# Patient Record
Sex: Male | Born: 1975 | Race: Black or African American | Hispanic: No | Marital: Married | Smoking: Former smoker
Health system: Southern US, Community
[De-identification: ages and names within clinical notes are randomized; demographics above are authoritative.]

## PROBLEM LIST (undated history)

## (undated) DIAGNOSIS — S6291XA Unspecified fracture of right wrist and hand, initial encounter for closed fracture: Secondary | ICD-10-CM

## (undated) DIAGNOSIS — Z973 Presence of spectacles and contact lenses: Secondary | ICD-10-CM

## (undated) DIAGNOSIS — T7840XA Allergy, unspecified, initial encounter: Secondary | ICD-10-CM

## (undated) DIAGNOSIS — L309 Dermatitis, unspecified: Secondary | ICD-10-CM

## (undated) DIAGNOSIS — K279 Peptic ulcer, site unspecified, unspecified as acute or chronic, without hemorrhage or perforation: Secondary | ICD-10-CM

## (undated) HISTORY — DX: Dermatitis, unspecified: L30.9

## (undated) HISTORY — DX: Allergy, unspecified, initial encounter: T78.40XA

## (undated) HISTORY — DX: Unspecified fracture of right wrist and hand, initial encounter for closed fracture: S62.91XA

## (undated) HISTORY — DX: Peptic ulcer, site unspecified, unspecified as acute or chronic, without hemorrhage or perforation: K27.9

## (undated) HISTORY — DX: Presence of spectacles and contact lenses: Z97.3

---

## 1999-04-24 ENCOUNTER — Emergency Department (HOSPITAL_COMMUNITY): Admission: EM | Admit: 1999-04-24 | Discharge: 1999-04-24 | Payer: Self-pay | Admitting: Emergency Medicine

## 2000-09-05 ENCOUNTER — Encounter: Payer: Self-pay | Admitting: Emergency Medicine

## 2000-09-05 ENCOUNTER — Emergency Department (HOSPITAL_COMMUNITY): Admission: EM | Admit: 2000-09-05 | Discharge: 2000-09-06 | Payer: Self-pay | Admitting: Emergency Medicine

## 2001-10-06 ENCOUNTER — Emergency Department (HOSPITAL_COMMUNITY): Admission: EM | Admit: 2001-10-06 | Discharge: 2001-10-06 | Payer: Self-pay | Admitting: Emergency Medicine

## 2001-12-27 ENCOUNTER — Emergency Department (HOSPITAL_COMMUNITY): Admission: EM | Admit: 2001-12-27 | Discharge: 2001-12-27 | Payer: Self-pay | Admitting: Emergency Medicine

## 2003-01-01 ENCOUNTER — Emergency Department (HOSPITAL_COMMUNITY): Admission: EM | Admit: 2003-01-01 | Discharge: 2003-01-01 | Payer: Self-pay | Admitting: Emergency Medicine

## 2004-04-24 ENCOUNTER — Emergency Department (HOSPITAL_COMMUNITY): Admission: EM | Admit: 2004-04-24 | Discharge: 2004-04-24 | Payer: Self-pay | Admitting: Emergency Medicine

## 2004-06-10 ENCOUNTER — Emergency Department (HOSPITAL_COMMUNITY): Admission: EM | Admit: 2004-06-10 | Discharge: 2004-06-10 | Payer: Self-pay | Admitting: Emergency Medicine

## 2004-08-21 ENCOUNTER — Emergency Department (HOSPITAL_COMMUNITY): Admission: EM | Admit: 2004-08-21 | Discharge: 2004-08-21 | Payer: Self-pay | Admitting: Emergency Medicine

## 2005-09-21 ENCOUNTER — Emergency Department (HOSPITAL_COMMUNITY): Admission: EM | Admit: 2005-09-21 | Discharge: 2005-09-21 | Payer: Self-pay | Admitting: Emergency Medicine

## 2006-08-19 ENCOUNTER — Emergency Department (HOSPITAL_COMMUNITY): Admission: EM | Admit: 2006-08-19 | Discharge: 2006-08-19 | Payer: Self-pay | Admitting: *Deleted

## 2010-08-20 HISTORY — PX: BONE CYST EXCISION: SHX376

## 2010-09-20 DIAGNOSIS — K279 Peptic ulcer, site unspecified, unspecified as acute or chronic, without hemorrhage or perforation: Secondary | ICD-10-CM

## 2010-09-20 HISTORY — DX: Peptic ulcer, site unspecified, unspecified as acute or chronic, without hemorrhage or perforation: K27.9

## 2010-10-16 ENCOUNTER — Emergency Department (HOSPITAL_COMMUNITY): Admission: EM | Admit: 2010-10-16 | Discharge: 2010-10-16 | Payer: Self-pay | Admitting: Emergency Medicine

## 2011-02-01 LAB — CBC
HCT: 43.1 % (ref 39.0–52.0)
Hemoglobin: 14.8 g/dL (ref 13.0–17.0)
MCH: 31.9 pg (ref 26.0–34.0)
MCHC: 34.3 g/dL (ref 30.0–36.0)
MCV: 93 fL (ref 78.0–100.0)
Platelets: 203 10*3/uL (ref 150–400)
RBC: 4.63 MIL/uL (ref 4.22–5.81)
RDW: 13.9 % (ref 11.5–15.5)
WBC: 14.4 10*3/uL — ABNORMAL HIGH (ref 4.0–10.5)

## 2011-02-01 LAB — COMPREHENSIVE METABOLIC PANEL
ALT: 31 U/L (ref 0–53)
AST: 25 U/L (ref 0–37)
Albumin: 4 g/dL (ref 3.5–5.2)
Alkaline Phosphatase: 67 U/L (ref 39–117)
BUN: 9 mg/dL (ref 6–23)
CO2: 23 mEq/L (ref 19–32)
Calcium: 9.4 mg/dL (ref 8.4–10.5)
Chloride: 106 mEq/L (ref 96–112)
Creatinine, Ser: 0.96 mg/dL (ref 0.4–1.5)
GFR calc Af Amer: >60 mL/min (ref >60)
GFR calc non Af Amer: >60 mL/min (ref >60)
Glucose, Bld: 105 mg/dL — ABNORMAL HIGH (ref 70–99)
Potassium: 4.2 mEq/L (ref 3.5–5.1)
Sodium: 137 mEq/L (ref 135–145)
Total Bilirubin: 0.4 mg/dL (ref 0.3–1.2)
Total Protein: 7.1 g/dL (ref 6.0–8.3)

## 2011-02-01 LAB — POCT CARDIAC MARKERS
CKMB, poc: <1 ng/mL — ABNORMAL LOW (ref 1.0–8.0)
Troponin i, poc: <0.05 ng/mL (ref 0.00–0.09)

## 2011-02-01 LAB — DIFFERENTIAL
Basophils Absolute: 0 10*3/uL (ref 0.0–0.1)
Basophils Relative: 0 % (ref 0–1)
Eosinophils Absolute: 0.2 10*3/uL (ref 0.0–0.7)
Eosinophils Relative: 1 % (ref 0–5)
Lymphocytes Relative: 19 % (ref 12–46)
Lymphs Abs: 2.7 10*3/uL (ref 0.7–4.0)
Monocytes Absolute: 0.8 10*3/uL (ref 0.1–1.0)
Monocytes Relative: 6 % (ref 3–12)
Neutro Abs: 10.7 10*3/uL — ABNORMAL HIGH (ref 1.7–7.7)
Neutrophils Relative %: 74 % (ref 43–77)

## 2011-02-01 LAB — URINALYSIS, ROUTINE W REFLEX MICROSCOPIC
Bilirubin Urine: NEGATIVE
Glucose, UA: NEGATIVE mg/dL
Hgb urine dipstick: NEGATIVE
Ketones, ur: NEGATIVE mg/dL
Nitrite: NEGATIVE
Protein, ur: NEGATIVE mg/dL
Specific Gravity, Urine: 1.021 (ref 1.005–1.030)
Urobilinogen, UA: 0.2 mg/dL (ref 0.0–1.0)
pH: 5.5 (ref 5.0–8.0)

## 2011-05-03 ENCOUNTER — Ambulatory Visit: Payer: Self-pay | Admitting: Medical

## 2011-05-08 ENCOUNTER — Ambulatory Visit (INDEPENDENT_AMBULATORY_CARE_PROVIDER_SITE_OTHER): Payer: 59 | Admitting: Medical

## 2011-05-08 ENCOUNTER — Encounter: Payer: Self-pay | Admitting: Medical

## 2011-05-08 DIAGNOSIS — Z0000 Encounter for general adult medical examination without abnormal findings: Secondary | ICD-10-CM

## 2011-05-08 DIAGNOSIS — R0600 Dyspnea, unspecified: Secondary | ICD-10-CM

## 2011-05-08 DIAGNOSIS — R350 Frequency of micturition: Secondary | ICD-10-CM

## 2011-05-08 DIAGNOSIS — J45909 Unspecified asthma, uncomplicated: Secondary | ICD-10-CM

## 2011-05-08 DIAGNOSIS — R0609 Other forms of dyspnea: Secondary | ICD-10-CM

## 2011-05-08 LAB — COMPREHENSIVE METABOLIC PANEL
ALT: 24 U/L (ref 0–53)
AST: 20 U/L (ref 0–37)
Albumin: 4.3 g/dL (ref 3.5–5.2)
Calcium: 9.6 mg/dL (ref 8.4–10.5)
Chloride: 106 mEq/L (ref 96–112)
Potassium: 4.4 mEq/L (ref 3.5–5.3)
Total Protein: 6.8 g/dL (ref 6.0–8.3)

## 2011-05-08 LAB — CBC WITH DIFFERENTIAL/PLATELET
Basophils Absolute: 0 10*3/uL (ref 0.0–0.1)
Basophils Relative: 0 % (ref 0–1)
MCHC: 32.1 g/dL (ref 30.0–36.0)
Neutro Abs: 7.8 10*3/uL — ABNORMAL HIGH (ref 1.7–7.7)
Neutrophils Relative %: 66 % (ref 43–77)
RDW: 13.7 % (ref 11.5–15.5)

## 2011-05-08 LAB — POCT URINALYSIS DIPSTICK
Protein, UA: NEGATIVE
Spec Grav, UA: 1.015
Urobilinogen, UA: NEGATIVE
pH, UA: 5

## 2011-05-08 LAB — LIPID PANEL
Cholesterol: 203 mg/dL — ABNORMAL HIGH (ref 0–200)
VLDL: 28 mg/dL (ref 0–40)

## 2011-05-08 LAB — TSH: TSH: 0.645 u[IU]/mL (ref 0.350–4.500)

## 2011-05-08 MED ORDER — ALBUTEROL SULFATE HFA 108 (90 BASE) MCG/ACT IN AERS: 2.0000 | INHALATION_SPRAY | Freq: Four times a day (QID) | RESPIRATORY_TRACT | Status: DC | PRN

## 2011-05-08 NOTE — Patient Instructions (Signed)
Preventative Care for Adults, Male       REGULAR HEALTH EXAMS:  A routine yearly physical is a good way to check in with your primary care provider about your health and preventive screening. It is also an opportunity to share updates about your health and any concerns you have, and receive a thorough all-over exam.   Most health insurance companies pay for at least some preventative services.  Check with your health plan for specific coverages.  WHAT PREVENTATIVE SERVICES DO MEN NEED?  Adult men should have their weight and blood pressure checked regularly.   Men age 35 and older should have their cholesterol levels checked regularly.  Beginning at age 50 and continuing to age 75, men should be screened for colorectal cancer.  Certain people should may need continued testing until age 85.  Other cancer screening may include exams for testicular and prostate cancer.  Updating vaccinations is part of preventative care.  Vaccinations help protect against diseases such as the flu.  Lab tests are generally done as part of preventative care to screen for anemia and blood disorders, to screen for problems with the kidneys and liver, to screen for bladder problems, to check blood sugar, and to check your cholesterol level.  Preventative services generally include counseling about diet, exercise, avoiding tobacco, drugs, excessive alcohol consumption, and sexually transmitted infections.    GENERAL RECOMMENDATIONS FOR GOOD HEALTH:  Healthy diet:  Eat a variety of foods, including fruit, vegetables, animal or vegetable protein, such as meat, fish, chicken, and eggs, or beans, lentils, tofu, and grains, such as rice.  Drink plenty of water daily.  Decrease saturated fat in the diet, avoid lots of red meat, processed foods, sweets, fast foods, and fried foods.  Exercise:  Aerobic exercise helps maintain good heart health. At least 30-40 minutes of moderate-intensity exercise is recommended.  For example, a brisk walk that increases your heart rate and breathing. This should be done on most days of the week.   Find a type of exercise or a variety of exercises that you enjoy so that it becomes a part of your daily life.  Examples are running, walking, swimming, water aerobics, and biking.  For motivation and support, explore group exercise such as aerobic class, spin class, Zumba, Yoga,or  martial arts, etc.    Set exercise goals for yourself, such as a certain weight goal, walk or run in a race such as a 5k walk/run.  Speak to your primary care provider about exercise goals.  Disease prevention:  If you smoke or chew tobacco, find out from your caregiver how to quit. It can literally save your life, no matter how long you have been a tobacco user. If you do not use tobacco, never begin.   Maintain a healthy diet and normal weight. Increased weight leads to problems with blood pressure and diabetes.   The Body Mass Index or BMI is a way of measuring how much of your body is fat. Having a BMI above 27 increases the risk of heart disease, diabetes, hypertension, stroke and other problems related to obesity. Your caregiver can help determine your BMI and based on it develop an exercise and dietary program to help you achieve or maintain this important measurement at a healthful level.  High blood pressure causes heart and blood vessel problems.  Persistent high blood pressure should be treated with medicine if weight loss and exercise do not work.   Fat and cholesterol leaves deposits in your arteries   that can block them. This causes heart disease and vessel disease elsewhere in your body.  If your cholesterol is found to be high, or if you have heart disease or certain other medical conditions, then you may need to have your cholesterol monitored frequently and be treated with medication.   Ask if you should have a stress test if your history suggests this. A stress test is a test done on  a treadmill that looks for heart disease. This test can find disease prior to there being a problem.  Avoid drinking alcohol in excess (more than two drinks per day).  Avoid use of street drugs. Do not share needles with anyone. Ask for professional help if you need assistance or instructions on stopping the use of alcohol, cigarettes, and/or drugs.  Brush your teeth twice a day with fluoride toothpaste, and floss once a day. Good oral hygiene prevents tooth decay and gum disease. The problems can be painful, unattractive, and can cause other health problems. Visit your dentist for a routine oral and dental check up and preventive care every 6-12 months.   Look at your skin regularly.  Use a mirror to look at your back. Notify your caregivers of changes in moles, especially if there are changes in shapes, colors, a size larger than a pencil eraser, an irregular border, or development of new moles.  Safety:  Use seatbelts 100% of the time, whether driving or as a passenger.  Use safety devices such as hearing protection if you work in environments with loud noise or significant background noise.  Use safety glasses when doing any work that could send debris in to the eyes.  Use a helmet if you ride a bike or motorcycle.  Use appropriate safety gear for contact sports.  Talk to your caregiver about gun safety.  Use sunscreen with a SPF (or skin protection factor) of 15 or greater.  Lighter skinned people are at a greater risk of skin cancer. Don't forget to also wear sunglasses in order to protect your eyes from too much damaging sunlight. Damaging sunlight can accelerate cataract formation.   Practice safe sex. Use condoms. Condoms are used for birth control and to help reduce the spread of sexually transmitted infections (or STIs).  Some of the STIs are gonorrhea (the clap), chlamydia, syphilis, trichomonas, herpes, HPV (human papilloma virus) and HIV (human immunodeficiency virus) which causes AIDS.  The herpes, HIV and HPV are viral illnesses that have no cure. These can result in disability, cancer and death.   Keep carbon monoxide and smoke detectors in your home functioning at all times. Change the batteries every 6 months or use a model that plugs into the wall.   Vaccinations:  Stay up to date with your tetanus shots and other required immunizations. You should have a booster for tetanus every 10 years. Be sure to get your flu shot every year, since 5%-20% of the [place] population comes down with the flu. The flu vaccine changes each year, so being vaccinated once is not enough. Get your shot in the fall, before the flu season peaks.   Other vaccines to consider:  Pneumococcal vaccine to protect against certain types of pneumonia.  This is normally recommended for adults age 65 or older.  However, adults younger than 37 years old with certain underlying conditions such as diabetes, heart or lung disease should also receive the vaccine.  Shingles vaccine to protect against Varicella Zoster if you are older than age 60, or younger   than 37 years old with certain underlying illness.  Hepatitis A vaccine to protect against a form of infection of the liver by a virus acquired from food.  Hepatitis B vaccine to protect against a form of infection of the liver by a virus acquired from blood or body fluids, particularly if you work in health care.  If you plan to travel internationally, check with your local health department for specific vaccination recommendations.  Cancer Screening:  Most routine colon cancer screening begins at the age of 50. On a yearly basis, doctors may provide special easy to use take-home tests to check for hidden blood in the stool. Sigmoidoscopy or colonoscopy can detect the earliest forms of colon cancer and is life saving. These tests use a small camera at the end of a tube to directly examine the colon. Speak to your caregiver about this at age 50, when routine  screening begins (and is repeated every 5 years unless early forms of pre-cancerous polyps or small growths are found).   At the age of 50 men usually start screening for prostate cancer every year. Screening may begin at a younger age for those with higher risk. Those at higher risk include African-Americans or having a family history of prostate cancer. There are two types of tests for prostate cancer:   Prostate-specific antigen (PSA) testing. Recent studies raise questions about prostate cancer using PSA and you should discuss this with your caregiver.   Digital rectal exam (in which your doctor's lubricated and gloved finger feels for enlargement of the prostate through the anus).   Screening for testicular cancer.  Do a monthly exam of your testicles. Gently roll each testicle between your thumb and fingers, feeling for any abnormal lumps. The best time to do this is after a hot shower or bath when the tissues are looser. Notify your caregivers of any lumps, tenderness or changes in size or shape immediately.     

## 2011-05-08 NOTE — Progress Notes (Signed)
Subjective:   HPI  Gregory Preston is a 35 y.o. male who presents as a new patient for a complete physical.    His wife encouraged him to get a physical due to some recent concerns.  He recently had an episode 1 wk ago where he felt some trouble breathing.  He had eaten spaghetti that evening, and during the night felt bloated, stomach seemed swollen, and he felt some difficulty with breathing.  He got up for a little while, and then laid   After a while the laid back down, but ended up having to urinate several times that evening.  Since that night has not had any additional similar symptoms.  In general he tries not to eat after 7pm.  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  Had surgical excision and has f/u soon for recheck with surgeon.  He was seen in late 2011 for peptic ulcer after NSAID use.  He had been seen in the ED for chest pain, but diagnosed with peptic ulcer.  No other c/o.  The following portions of the patient's history were reviewed and updated as appropriate: allergies, current medications, past family history, past medical history, past social history, past surgical history and problem list.  Past Medical History  Diagnosis Date  . Asthma   . Allergy   . Eczema   . Wears glasses   . Peptic ulcer disease 11/11    s/p NSAID use, resolved   Past Surgical History  Procedure Date  . Bone cyst excision 10/11    mandible cyst excised    Family History  Problem Relation Age of Onset  . Peripheral vascular disease Father   . Heart disease Maternal Grandfather    No current outpatient prescriptions on file prior to visit.   No Known Allergies   Review of Systems Constitutional: denies fever, chills, sweats, unexpected weight change, anorexia, fatigue Allergy: +occasional congestion, sneezing - works outside in Aeronautical [employer] Dermatology: denies rash ENT: no runny nose, ear pain, sore throat, hoarseness, sinus pain Cardiology: denies chest pain,  palpitations, edema Respiratory: +SOB 1 evening 1 week ago, otherwise, denies cough, shortness of breath, wheezing Gastroenterology: +1 wk ago with distention, mild pain, otherwise denies nausea, vomiting, diarrhea, constipation Hematology: denies bleeding or bruising problems Musculoskeletal: +left knee pain occasionally, otherwise denies myalgias, joint swelling, back pain Ophthalmology: denies vision changes Urology: +1 wk ago with 1 day of urinary frequency, otherwise denies dysuria, difficulty urinating, hematuria, urinary frequency, urgency Neurology: no headache, weakness, tingling, numbness     Objective:   Physical Exam  General appearance: alert, no distress, WD/WN, black male, obese Skin: unremarkable HEENT: normocephalic, sclerae anicteric, PERRLA, EOMi, nares patent, no discharge or erythema, pharynx normal Oral cavity: MMM, no lesions Neck: supple, no lymphadenopathy, no thyromegaly, no masses, no JVD, no bruits Heart: RRR, normal S1, S2, no murmurs Lungs: CTA bilaterally, no wheezes, rhonchi, or rales Abdomen: +bs, soft, non tender, non distended, no masses, no hepatomegaly, no splenomegaly Back: non tender Musculoskeletal: nontender, no swelling, no obvious deformity Extremities: no edema, no cyanosis, no clubbing Pulses: 2+ symmetric, upper and lower extremities, normal cap refill Neurological: alert, oriented x 3, CN2-12 intact, strength normal upper extremities and lower extremities, sensation normal throughout, DTRs 2+ throughout, no cerebellar signs, gait normal GU: nontender, no masses, no hernias, no lesions, normal male external genitalia Psychiatric: normal affect, behavior normal, pleasant    Assessment :    Encounter Diagnoses  Name Primary?  Marland Kitchen  General medical examination Yes  . Urinary frequency   . Dyspnea   . Asthma      Plan:  Advised that his recent symptoms resolved without recurrence, so I suspect he had some abdominal distention, likely due  to diet and increased sodium intake, and the abdominal distention likely led to the dyspnea.  Since it resolved, I don't suspect anything else worrisome.  However, he is obese and smokes.  His father has severe peripheral vascular disease.  Thus, I advised he stop smoking, begin some regular exercise, start eating healthy and try cutting calories down to 2000 daily.  Discussed health lifestyle.  Reviewed his ED notes and EKG from 09/2010 through [facility].  White count was elevate, glucose elevated, questionable fatty liver disease on acute abdominal xray series, and EKG was normal.  Labs today and, we will call with lab results.  Handout on preventive care given today.  Asthma - controlled, refilled inhaler.

## 2011-05-10 ENCOUNTER — Telehealth: Payer: Self-pay | Admitting: *Deleted

## 2011-05-10 NOTE — Telephone Encounter (Addendum)
Message copied by Dorthula Perfect on Wed May 10, 2011  4:53 PM ------      Message from: Jac Canavan      Created: Tue May 09, 2011  8:48 PM       His glucose was a little elevated.  It had also been elevated at the ED visit a few months ago.  Otherwise, his liver, kidney, lytes, cholesterol, thyroid, and urine labs are normal.  He may be at risk for diabetes given ongoing elevated blood glucose.  I want him to work on regular exercise, healthy diet, cut out fast food, large portions, sweets, regular cola/sweet tea, and try and lose weight.  Lets see him back in 54mo on weight, diet, exercise and recheck glucose at that time.  Recheck sooner prn.   Pt notified of lab results.  Agreed to exercise and lose weight.  Scheduled an appointment for 10-09-11 at 9am for a 6 month f/u on weight, diet and exercise and recheck glucose.  CM, LPN

## 2011-10-09 ENCOUNTER — Encounter: Payer: Self-pay | Admitting: Medical

## 2011-10-09 ENCOUNTER — Ambulatory Visit (INDEPENDENT_AMBULATORY_CARE_PROVIDER_SITE_OTHER): Payer: 59 | Admitting: Medical

## 2011-10-09 VITALS — BP 122/80 | HR 68 | Temp 98.4°F | Resp 18 | Ht 72.0 in | Wt 351.0 lb

## 2011-10-09 DIAGNOSIS — R7301 Impaired fasting glucose: Secondary | ICD-10-CM | POA: Insufficient documentation

## 2011-10-09 DIAGNOSIS — E669 Obesity, unspecified: Secondary | ICD-10-CM

## 2011-10-09 LAB — POCT GLYCOSYLATED HEMOGLOBIN (HGB A1C): Hemoglobin A1C: 5.4

## 2011-10-09 NOTE — Progress Notes (Signed)
Subjective:   HPI  Gregory Preston is a 35 y.o. male who presents for 6 mo f/u.   I saw him for physical back in June, and at that time his glucose was 105 and he was advised to make exercise and diet changes to lose weight.  Since then he has cut out regular soda and fried foods, but only eats 1 meal daily, big meal at dinner.  He owns 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 No recent asthma problems, no other problems. No other aggravating or relieving factors.    No other c/o.  The following portions of the patient's history were reviewed and updated as appropriate: allergies, current medications, past family history, past medical history, past social history, past surgical history and problem list.  Past Medical History  Diagnosis Date  . Asthma   . Allergy   . Eczema   . Wears glasses   . Peptic ulcer disease 11/11    s/p NSAID use, resolved   Review of Systems Constitutional: -fever, -chills, -sweats, -unexpected -weight change,-fatigue ENT: -runny nose, -ear pain, -sore throat Cardiology:  -chest pain, -palpitations, -edema Respiratory: -cough, -shortness of breath, -wheezing Gastroenterology: -abdominal pain, -nausea, -vomiting, -diarrhea, -constipation Hematology: -bleeding or bruising problems Musculoskeletal: -arthralgias, -myalgias, -joint swelling, -back pain Ophthalmology: -vision changes Urology: -dysuria, -difficulty urinating, -hematuria, -urinary frequency, -urgency Neurology: -headache, -weakness, -tingling, -numbness   Objective:   Physical Exam  Filed Vitals:   10/09/11 0914  BP: 122/80  Pulse: 68  Temp: 98.4 F (36.9 C)  Resp: 18    General appearance: alert, no distress, WD/WN, obese male Neck: supple, no lymphadenopathy, no thyromegaly, no masses Heart: RRR, normal S1, S2, no murmurs Lungs: CTA bilaterally, no wheezes, rhonchi, or rales Abdomen: +bs, soft, non tender, non distended, no masses,  no hepatomegaly, no splenomegaly Pulses: 2+ symmetric, upper and lower extremities, normal cap refill   Assessment and Plan :    Encounter Diagnoses  Name Primary?  . Obesity Yes  . Impaired fasting blood sugar    HgbA1C 5.4% today. Referral to dietician.  Advised 30+ minutes of exercise daily getting the heart rate up.  Advised he make significant additional diet changes, eat 3 meals daily, but healthy choices.   Follow-up 38mo.

## 2011-11-07 ENCOUNTER — Telehealth: Payer: Self-pay | Admitting: Internal Medicine

## 2011-11-07 ENCOUNTER — Other Ambulatory Visit: Payer: Self-pay | Admitting: Medical

## 2011-11-07 ENCOUNTER — Telehealth: Payer: Self-pay | Admitting: Medical

## 2011-11-07 MED ORDER — ALBUTEROL SULFATE HFA 108 (90 BASE) MCG/ACT IN AERS: 2.0000 | INHALATION_SPRAY | Freq: Four times a day (QID) | RESPIRATORY_TRACT | Status: DC | PRN

## 2011-11-07 NOTE — Telephone Encounter (Signed)
Refill sent but needs appt as his asthma is apparently not controlled

## 2011-11-07 NOTE — Telephone Encounter (Signed)
shane did refill meds but pt was informed that he needs appointment that his asthma is not controlled if he has gone thru that many refills.

## 2011-11-22 ENCOUNTER — Encounter: Payer: Self-pay | Admitting: Medical

## 2011-11-22 ENCOUNTER — Ambulatory Visit (INDEPENDENT_AMBULATORY_CARE_PROVIDER_SITE_OTHER): Payer: 59 | Admitting: Medical

## 2011-11-22 DIAGNOSIS — J45909 Unspecified asthma, uncomplicated: Secondary | ICD-10-CM | POA: Insufficient documentation

## 2011-11-22 MED ORDER — BECLOMETHASONE DIPROPIONATE 80 MCG/ACT IN AERS: 1.0000 | INHALATION_SPRAY | RESPIRATORY_TRACT | Status: DC | PRN

## 2011-11-22 MED ORDER — ALBUTEROL SULFATE HFA 108 (90 BASE) MCG/ACT IN AERS: 2.0000 | INHALATION_SPRAY | Freq: Four times a day (QID) | RESPIRATORY_TRACT | Status: DC | PRN

## 2011-11-22 NOTE — Progress Notes (Signed)
Subjective:   HPI  Gregory Preston is a 36 y.o. male who presents  with recheck on asthma.  He has been using his inhaler several days per week, has frequent wheezing and tightness in his chest. He was diagnosed with asthma as a child. He has never been on a controller medication. 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  He notes that he normally gets flareups with the season change with cold weather as well as spring and fall with temperature changes and pollen changes.  Otherwise has been in his usual state of health.   No other aggravating or relieving factors.    No other c/o.  The following portions of the patient's history were reviewed and updated as appropriate: allergies, current medications, past family history, past medical history, past social history, past surgical history and problem list.  No Known Allergies  No current outpatient prescriptions on file prior to visit.    Past Medical History  Diagnosis Date  . Asthma   . Allergy   . Eczema   . Wears glasses   . Peptic ulcer disease 11/11    s/p NSAID use, resolved    Past Surgical History  Procedure Date  . Bone cyst excision 10/11    mandible cyst excised    Family History  Problem Relation Age of Onset  . Peripheral vascular disease Father   . Heart disease Father     pacemaker  . Heart disease Maternal Grandfather   . Hypertension Neg Hx   . Hyperlipidemia Neg Hx   . Stroke Neg Hx   . Diabetes Neg Hx     History   Social History  . Marital Status: Married    Spouse Name: N/A    Number of Children: N/A  . Years of Education: N/A   Occupational History  . Not on file.   Social History Main Topics  . Smoking status: Former Smoker -- 0.2 packs/day for 10 years    Types: Cigars  . Smokeless tobacco: Never Used  . Alcohol Use: 1.0 oz/week    2 drink(s) per week  . Drug Use: No  . Sexually Active: Not on file     married;  does landscaping; exercise  through work, otherwise no   Other Topics Concern  . Not on file   Social History Narrative  . No narrative on file    Review of Systems Constitutional: -fever, -chills, -sweats, -unexpected -weight change,-fatigue ENT: -runny nose, -ear pain, -sore throat Cardiology:  -chest pain, -palpitations, -edema Respiratory: -cough, +shortness of breath, +wheezing Gastroenterology: -abdominal pain, -nausea, -vomiting, -diarrhea, -constipation    Objective:   Physical Exam  Filed Vitals:   11/22/11 0813  BP: 110/70  Pulse: 88  Temp: 97.9 F (36.6 C)  Resp: 18    General appearence: alert, no distress, WD/WN, obese HEENT: normocephalic, sclerae anicteric, TMs pearly, nares patent, no discharge or erythema, pharynx normal Oral cavity: MMM, no lesions Neck: supple, no lymphadenopathy, no thyromegaly, no masses Heart: RRR, normal S1, S2, no murmurs Lungs: CTA bilaterally, no wheezes, rhonchi, or rales   Assessment and Plan :    Encounter Diagnosis  Name Primary?  Marland Kitchen Asthma Yes   Discussed triggers of asthma, discussed that currently he falls into the severe category of asthma.   We will set him up for pulmonary function studies.  He will begin Qvar once a day, and we'll increase to twice a day in week if symptoms are  not improving.  He'll begin Zyrtec 10 mg over-the-counter nighttime.  He will continue use his albuterol for acute flareups.  Discussed possible triggers of his asthma, discussed different types of medication including controller versus rescue medication.   Call report in 3-4 weeks on symptoms and frequency of rescue inhaler usage.  We will call once we receive pulmonary function study results.

## 2011-11-22 NOTE — Patient Instructions (Addendum)
Your current asthma symptoms are not controlled.  Begin Qvar 80 mcg, 1 inhalation in the morning. Always rinse mouth out with water after each use.   This medication is to help CONTROL your asthma symptoms so you aren't wheezy or as tight.  If you are still having to use your albuterol more than twice weekly, the use the Qvar both morning and night.   Please call me in 3-4 weeks, and give me an update on your symptoms.  You will still use your albuterol inhaler for RESCUE/EMERGENCY use when wheezy or short of breath.   Asthma Prevention Cigarette smoke, house dust, molds, pollens, animal dander, certain insects, exercise, and even cold air are all triggers that can cause an asthma attack. Often, no specific triggers are identified.  Take the following measures around your house to reduce attacks:  Avoid cigarette and other smoke. No smoking should be allowed in a home where someone with asthma lives. If smoking is allowed indoors, it should be done in a room with a closed door, and a window should be opened to clear the air. If possible, do not use a wood-burning stove, kerosene heater, or fireplace. Minimize exposure to all sources of smoke, including incense, candles, fires, and fireworks.   Decrease pollen exposure. Keep your windows shut and use central air during the pollen allergy season. Stay indoors with windows closed from late morning to afternoon, if you can. Avoid mowing the lawn if you have grass pollen allergy. Change your clothes and shower after being outside during this time of year.   Remove molds from bathrooms and wet areas. Do this by cleaning the floors with a fungicide or diluted bleach. Avoid using humidifiers, vaporizers, or swamp coolers. These can spread molds through the air. Fix leaky faucets, pipes, or other sources of water that have mold around them.   Decrease house dust exposure. Do this by using bare floors, vacuuming frequently, and changing furnace and air cooler  filters frequently. Avoid using feather, wool, or foam bedding. Use polyester pillows and plastic covers over your mattress. Wash bedding weekly in hot water (hotter than 130 F).   Try to get someone else to vacuum for you once or twice a week, if you can. Stay out of rooms while they are being vacuumed and for a short while afterward. If you vacuum, use a dust mask (from a hardware store), a double-layered or microfilter vacuum cleaner bag, or a vacuum cleaner with a HEPA filter.   Avoid perfumes, talcum powder, hair spray, paints and other strong odors and fumes.   Keep warm-blooded pets (cats, dogs, rodents, birds) outside the home if they are triggers for asthma. If you can't keep the pet outdoors, keep the pet out of your bedroom and other sleeping areas at all times, and keep the door closed. Remove carpets and furniture covered with cloth from your home. If that is not possible, keep the pet away from fabric-covered furniture and carpets.   Eliminate cockroaches. Keep food and garbage in closed containers. Never leave food out. Use poison baits, traps, powders, gels, or paste (for example, boric acid). If a spray is used to kill cockroaches, stay out of the room until the odor goes away.   Decrease indoor humidity to less than 60%. Use an indoor air cleaning device.   Avoid sulfites in foods and beverages. Do not drink beer or wine or eat dried fruit, processed potatoes, or shrimp if they cause asthma symptoms.   Avoid cold  air. Cover your nose and mouth with a scarf on cold or windy days.   Avoid aspirin. This is the most common drug causing serious asthma attacks.   If exercise triggers your asthma, ask your caregiver how you should prepare before exercising. (For example, ask if you could use your inhaler 10 minutes before exercising.)   Avoid close contact with people who have a cold or the flu since your asthma symptoms may get worse if you catch the infection from them. Wash your  hands thoroughly after touching items that may have been handled by others with a respiratory infection.   Get a flu shot every year to protect against the flu virus, which often makes asthma worse for days to weeks. Also get a pneumonia shot once every five to 10 years.  Call your caregiver if you want further information about measures you can take to help prevent asthma attacks. Document Released: 11/06/2005 Document Revised: 07/19/2011 Document Reviewed: 09/14/2009 [facility] Patient Information 2012 [place], [place].

## 2011-11-23 ENCOUNTER — Encounter (HOSPITAL_COMMUNITY): Payer: 59

## 2011-12-01 NOTE — Telephone Encounter (Signed)
done

## 2011-12-11 ENCOUNTER — Telehealth: Payer: Self-pay | Admitting: Internal Medicine

## 2011-12-12 ENCOUNTER — Other Ambulatory Visit: Payer: Self-pay | Admitting: Medical

## 2011-12-12 MED ORDER — ALBUTEROL SULFATE HFA 108 (90 BASE) MCG/ACT IN AERS: 2.0000 | INHALATION_SPRAY | Freq: Four times a day (QID) | RESPIRATORY_TRACT | Status: DC | PRN

## 2011-12-12 NOTE — Telephone Encounter (Signed)
rx sent

## 2012-01-19 DIAGNOSIS — S6291XA Unspecified fracture of right wrist and hand, initial encounter for closed fracture: Secondary | ICD-10-CM

## 2012-01-19 HISTORY — DX: Unspecified fracture of right wrist and hand, initial encounter for closed fracture: S62.91XA

## 2012-01-29 ENCOUNTER — Emergency Department (HOSPITAL_COMMUNITY): Payer: 59

## 2012-01-29 ENCOUNTER — Emergency Department (HOSPITAL_COMMUNITY)
Admission: EM | Admit: 2012-01-29 | Discharge: 2012-01-29 | Disposition: A | Discharge status: Home / Self Care | Payer: 59 | Attending: Emergency Medicine | Admitting: Emergency Medicine

## 2012-01-29 ENCOUNTER — Encounter (HOSPITAL_COMMUNITY): Payer: Self-pay | Admitting: *Deleted

## 2012-01-29 DIAGNOSIS — W2209XA Striking against other stationary object, initial encounter: Secondary | ICD-10-CM | POA: Insufficient documentation

## 2012-01-29 DIAGNOSIS — M79609 Pain in unspecified limb: Secondary | ICD-10-CM | POA: Insufficient documentation

## 2012-01-29 DIAGNOSIS — S6290XA Unspecified fracture of unspecified wrist and hand, initial encounter for closed fracture: Secondary | ICD-10-CM | POA: Insufficient documentation

## 2012-01-29 MED ORDER — OXYCODONE-ACETAMINOPHEN 5-325 MG PO TABS: 1.0000 | ORAL_TABLET | ORAL | Status: AC | PRN

## 2012-01-29 NOTE — ED Notes (Signed)
Pt reports punched wall yesterday and now has pain/swelling to right hand.

## 2012-01-29 NOTE — ED Provider Notes (Signed)
History     CSN: [id number]  Arrival date & time 01/29/12  1004   First MD Initiated Contact with Patient 01/29/12 1034      Chief Complaint  Patient presents with  . Hand Injury    Right Hand    (Consider location/radiation/quality/duration/timing/severity/associated sxs/prior treatment) Patient is a 36 y.o. male presenting with hand injury. The history is provided by the patient.  Hand Injury  The incident occurred yesterday. The injury mechanism was a direct blow. The pain is present in the right hand. The quality of the pain is described as throbbing and sharp. The pain is moderate. The pain has been constant since the incident. Pertinent negatives include no fever. Associated symptoms comments: He hit a wall with fist last night and has persistent pain over 3rd, and 4th MCP joints. .    Past Medical History  Diagnosis Date  . Asthma   . Allergy   . Eczema   . Wears glasses   . Peptic ulcer disease 11/11    s/p NSAID use, resolved    Past Surgical History  Procedure Date  . Bone cyst excision 10/11    mandible cyst excised    Family History  Problem Relation Age of Onset  . Peripheral vascular disease Father   . Heart disease Father     pacemaker  . Heart disease Maternal Grandfather   . Hypertension Neg Hx   . Hyperlipidemia Neg Hx   . Stroke Neg Hx   . Diabetes Neg Hx     History  Substance Use Topics  . Smoking status: Former Smoker -- 0.2 packs/day for 10 years    Types: Cigars  . Smokeless tobacco: Never Used  . Alcohol Use: 1.0 oz/week    2 drink(s) per week      Review of Systems  Constitutional: Negative for fever and chills.  HENT: Negative.   Respiratory: Negative.   Cardiovascular: Negative.   Gastrointestinal: Negative.   Musculoskeletal: Negative.        See HPI  Skin: Negative.   Neurological: Negative.     Allergies  Review of patient's allergies indicates no known allergies.  Home Medications   Current Outpatient Rx    Name Route Sig Dispense Refill  . ALBUTEROL SULFATE HFA 108 (90 BASE) MCG/ACT IN AERS Inhalation Inhale 2 puffs into the lungs every 6 (six) hours as needed. Wheezing    . BECLOMETHASONE DIPROPIONATE 80 MCG/ACT IN AERS Inhalation Inhale 1 puff into the lungs as needed. 1 Inhaler 5    BP 143/75  Pulse 86  Temp(Src) 99.1 F (37.3 C) (Oral)  Resp 16  Ht 5\' 10"  (1.778 m)  Wt 320 lb (145.151 kg)  BMI 45.92 kg/m2  SpO2 96%  Physical Exam  Constitutional: He is oriented to person, place, and time. He appears well-developed and well-nourished.  Neck: Normal range of motion.  Pulmonary/Chest: Effort normal.  Musculoskeletal: Normal range of motion.       Left hand moderately swollen dorsally with nonsuturable, deep abrasion overlying 3rd MCP joint. Ecchymosis to palmar central hand without swelling. Distal neurovascular exam intact to all digits.  Neurological: He is alert and oriented to person, place, and time.  Skin: Skin is warm and dry.  Psychiatric: He has a normal mood and affect.    ED Course  Procedures (including critical care time)  Labs Reviewed - No data to display No results found.   No diagnosis found.    MDM  Patient denies contact with  another person, specifically, fist-to-mouth injury causing hand abrasion.  D/w Dr. Mina Marble via his nurse. Ok to splint and follow up in office tomorrow.  Rodena Medin, PA-C 01/29/12 1142

## 2012-01-29 NOTE — Discharge Instructions (Signed)
Cast or Splint Care Casts and splints support injured limbs and keep bones from moving while they heal.  HOME CARE  Keep the cast or splint uncovered during the drying period.   A plaster cast can take 24 to 48 hours to dry.   A fiberglass cast will dry in less than 1 hour.   Do not rest the cast on anything harder than a pillow for 24 hours.   Do not put weight on your injured limb. Do not put pressure on the cast. Wait for your doctor's approval.   Keep the cast or splint dry.   Cover the cast or splint with a plastic bag during baths or wet weather.   If you have a cast over your chest and belly (trunk), take sponge baths until the cast is taken off.   Keep your cast or splint clean. Wash a dirty cast with a damp cloth.   Do not put any objects under your cast or splint. Do not scratch the skin under the cast with an object.   Do not take out the padding from inside your cast.   Exercise your joints near the cast as told by your doctor.   Raise (elevate) your injured limb on 1 or 2 pillows for the first 1 to 3 days.  GET HELP RIGHT AWAY IF:  Your cast or splint cracks.   Your cast or splint is too tight or too loose.   You itch badly under the cast.   Your cast gets wet or has a soft spot.   You have a bad smell coming from the cast.   You get an object stuck under the cast.   Your skin around the cast becomes red or raw.   You have new or more pain after the cast is put on.   You have fluid leaking through the cast.   You cannot move your fingers or toes.   Your fingers or toes turn colors or are cool, painful, or puffy (swollen).   You have tingling or lose feeling (numbness) around the injured area.   You have pain or pressure under the cast.   You have trouble breathing or have shortness of breath.   You have chest pain.  MAKE SURE YOU:  Understand these instructions.   Will watch your condition.   Will get help right away if you are not doing  well or get worse.  Document Released: 03/08/2011 Document Revised: 10/26/2011 Document Reviewed: 03/08/2011 [facility] Patient Information 2012 [place], [place].  Hand Fracture, Metacarpals Fractures of metacarpals are breaks in the bones of the hand. They extend from the knuckles to the wrist. These bones can undergo many types of fractures. There are different ways of treating these fractures, all of which may be correct. TREATMENT  Hand fractures can be treated with:   Non-reduction - The fracture is casted without changing the positions of the fracture (bone pieces) involved. This fracture is usually left in a cast for 4 to 6 weeks or as your caregiver thinks necessary.   Closed reduction - The bones are moved back into position without surgery and then casted.   ORIF (open reduction and internal fixation) - The fracture site is opened and the bone pieces are fixed into place with some type of hardware, such as screws, etc. They are then casted.  Your caregiver will discuss the type of fracture you have and the treatment that should be best for that problem. If surgery is chosen,  let your caregivers know about the following.  LET YOUR CAREGIVERS KNOW ABOUT:  Allergies.   Medications you are taking, including herbs, eye drops, over the counter medications, and creams.   Use of steroids (by mouth or creams).   Previous problems with anesthetics or novocaine.   Possibility of pregnancy.   History of blood clots (thrombophlebitis).   History of bleeding or blood problems.   Previous surgeries.   Other health problems.  AFTER THE PROCEDURE After surgery, you will be taken to the recovery area where a nurse will watch and check your progress. Once you are awake, stable, and taking fluids well, barring other problems, you'll be allowed to go home. Once home, an ice pack applied to your operative site may help with pain and keep the swelling down. HOME CARE INSTRUCTIONS   Follow your  caregiver's instructions as to activities, exercises, physical therapy, and driving a car.   Daily exercise is helpful for keeping range of motion and strength. Exercise as instructed.   To lessen swelling, keep the injured hand elevated above the level of your heart as much as possible.   Apply ice to the injury for 15 to 20 minutes each hour while awake for the first 2 days. Put the ice in a plastic bag and place a thin towel between the bag of ice and your cast.   Move the fingers of your casted hand several times a day.   If a plaster or fiberglass cast was applied:   Do not try to scratch the skin under the cast using a sharp or pointed object.   Check the skin around the cast every day. You may put lotion on red or sore areas.   Keep your cast dry. Your cast can be protected during bathing with a plastic bag. Do not put your cast into the water.   If a plaster splint was applied:   Wear your splint for as long as directed by your caregiver or until seen again.   Do not get your splint wet. Protect it during bathing with a plastic bag.   You may loosen the elastic bandage around the splint if your fingers start to get numb, tingle, get cold or turn blue.   Do not put pressure on your cast or splint; this may cause it to break. Especially, do not lean plaster casts on hard surfaces for 24 hours after application.   Take medications as directed by your caregiver.   Only take over-the-counter or prescription medicines for pain, discomfort, or fever as directed by your caregiver.   Follow-up as provided by your caregiver. This is very important in order to avoid permanent injury or disability and chronic pain.  SEEK MEDICAL CARE IF:   Increased bleeding (more than a small spot) from beneath your cast or splint if there is beneath the cast as with an open reduction.   Redness, swelling, or increasing pain in the wound or from beneath your cast or splint.   Pus coming from wound  or from beneath your cast or splint.   An unexplained oral temperature above 102 F (38.9 C) develops, or as your caregiver suggests.   A foul smell coming from the wound or dressing or from beneath your cast or splint.   You have a problem moving any of your fingers.  SEEK IMMEDIATE MEDICAL CARE IF:   You develop a rash   You have difficulty breathing   You have any allergy problems  If  you do not have a window in your cast for observing the wound, a discharge or minor bleeding may show up as a stain on the outside of your cast. Report these findings to your caregiver. MAKE SURE YOU:   Understand these instructions.   Will watch your condition.   Will get help right away if you are not doing well or get worse.  Document Released: 11/06/2005 Document Revised: 10/26/2011 Document Reviewed: 06/25/2008 [facility] Patient Information 2012 [place], [place].  Cryotherapy Cryotherapy means treatment with cold. Ice or gel packs can be used to reduce both pain and swelling. Ice is the most helpful within the first 24 to 48 hours after an injury or flareup from overusing a muscle or joint. Sprains, strains, spasms, burning pain, shooting pain, and aches can all be eased with ice. Ice can also be used when recovering from surgery. Ice is effective, has very few side effects, and is safe for most people to use. PRECAUTIONS  Ice is not a safe treatment option for people with:  Raynaud's phenomenon. This is a condition affecting small blood vessels in the extremities. Exposure to cold may cause your problems to return.   Cold hypersensitivity. There are many forms of cold hypersensitivity, including:   Cold urticaria. Red, itchy hives appear on the skin when the tissues begin to warm after being iced.   Cold erythema. This is a red, itchy rash caused by exposure to cold.   Cold hemoglobinuria. Red blood cells break down when the tissues begin to warm after being iced. The hemoglobin that carry  oxygen are passed into the urine because they cannot combine with blood proteins fast enough.   Numbness or altered sensitivity in the area being iced.  If you have any of the following conditions, do not use ice until you have discussed cryotherapy with your caregiver:  Heart conditions, such as arrhythmia, angina, or chronic heart disease.   High blood pressure.   Healing wounds or open skin in the area being iced.   Current infections.   Rheumatoid arthritis.   Poor circulation.   Diabetes.  Ice slows the blood flow in the region it is applied. This is beneficial when trying to stop inflamed tissues from spreading irritating chemicals to surrounding tissues. However, if you expose your skin to cold temperatures for too long or without the proper protection, you can damage your skin or nerves. Watch for signs of skin damage due to cold. HOME CARE INSTRUCTIONS Follow these tips to use ice and cold packs safely.  Place a dry or damp towel between the ice and skin. A damp towel will cool the skin more quickly, so you may need to shorten the time that the ice is used.   For a more rapid response, add gentle compression to the ice.   Ice for no more than 10 to 20 minutes at a time. The bonier the area you are icing, the less time it will take to get the benefits of ice.   Check your skin after 5 minutes to make sure there are no signs of a poor response to cold or skin damage.   Rest 20 minutes or more in between uses.   Once your skin is numb, you can end your treatment. You can test numbness by very lightly touching your skin. The touch should be so light that you do not see the skin dimple from the pressure of your fingertip. When using ice, most people will feel these normal sensations  in this order: cold, burning, aching, and numbness.   Do not use ice on someone who cannot communicate their responses to pain, such as small children or people with dementia.  HOW TO MAKE AN ICE  PACK Ice packs are the most common way to use ice therapy. Other methods include ice massage, ice baths, and cryo-sprays. Muscle creams that cause a cold, tingly feeling do not offer the same benefits that ice offers and should not be used as a substitute unless recommended by your caregiver. To make an ice pack, do one of the following:  Place crushed ice or a bag of frozen vegetables in a sealable plastic bag. Squeeze out the excess air. Place this bag inside another plastic bag. Slide the bag into a pillowcase or place a damp towel between your skin and the bag.   Mix 3 parts water with 1 part rubbing alcohol. Freeze the mixture in a sealable plastic bag. When you remove the mixture from the freezer, it will be slushy. Squeeze out the excess air. Place this bag inside another plastic bag. Slide the bag into a pillowcase or place a damp towel between your skin and the bag.  SEEK MEDICAL CARE IF:  You develop white spots on your skin. This may give the skin a blotchy (mottled) appearance.   Your skin turns blue or pale.   Your skin becomes waxy or hard.   Your swelling gets worse.  MAKE SURE YOU:   Understand these instructions.   Will watch your condition.   Will get help right away if you are not doing well or get worse.  Document Released: 07/03/2011 Document Revised: 10/26/2011 Document Reviewed: 07/03/2011 [facility] Patient Information 2012 [place], [place].

## 2012-02-15 NOTE — ED Provider Notes (Signed)
Evaluation and management procedures were performed by the PA/NP under my supervision/collaboration.   Jock Mahon D Yiselle Babich, MD 02/15/12 1013 

## 2012-04-08 ENCOUNTER — Ambulatory Visit: Payer: 59 | Admitting: Medical

## 2012-07-13 ENCOUNTER — Emergency Department (HOSPITAL_COMMUNITY)
Admission: EM | Admit: 2012-07-13 | Discharge: 2012-07-14 | Disposition: A | Discharge status: Home / Self Care | Payer: 59 | Attending: Emergency Medicine | Admitting: Emergency Medicine

## 2012-07-13 ENCOUNTER — Encounter (HOSPITAL_COMMUNITY): Payer: Self-pay | Admitting: Emergency Medicine

## 2012-07-13 DIAGNOSIS — J45909 Unspecified asthma, uncomplicated: Secondary | ICD-10-CM | POA: Insufficient documentation

## 2012-07-13 DIAGNOSIS — S39012A Strain of muscle, fascia and tendon of lower back, initial encounter: Secondary | ICD-10-CM

## 2012-07-13 DIAGNOSIS — S335XXA Sprain of ligaments of lumbar spine, initial encounter: Secondary | ICD-10-CM | POA: Insufficient documentation

## 2012-07-13 DIAGNOSIS — X500XXA Overexertion from strenuous movement or load, initial encounter: Secondary | ICD-10-CM | POA: Insufficient documentation

## 2012-07-13 DIAGNOSIS — Z87891 Personal history of nicotine dependence: Secondary | ICD-10-CM | POA: Insufficient documentation

## 2012-07-13 DIAGNOSIS — Y998 Other external cause status: Secondary | ICD-10-CM | POA: Insufficient documentation

## 2012-07-13 DIAGNOSIS — Y9389 Activity, other specified: Secondary | ICD-10-CM | POA: Insufficient documentation

## 2012-07-13 NOTE — ED Notes (Signed)
Pt alert, arrives from home, c/o low back pain, onset was yesterday while lifting, pt ambulates to triage, steady gait, resp even unlabored, skin pwd

## 2012-07-13 NOTE — ED Provider Notes (Signed)
History     CSN: [id number]  Arrival date & time 07/13/12  2032   First MD Initiated Contact with Patient 07/13/12 2318      Chief Complaint  Patient presents with  . Back Pain    (Consider location/radiation/quality/duration/timing/severity/associated sxs/prior treatment) The history is provided by the patient, the spouse and medical records.   Gregory Preston is a 36 y.o. male presents to the emergency department complaining of back pain.  The onset of the symptoms was  abrupt starting 1 day ago.  The patient has associated radiation into the buttock and sometimes on the left thigh.  The symptoms have been  persistent, gradually worsened.  Movement, sitting straight makes the symptoms worse and lying flat makes symptoms better.  The patient denies C-spine, L-spine pain, numbness, tingling, weakness, saddle anesthesia, bowel or bladder incontinence.  Patient states he bent down yesterday to unhook his trailer from his truck and when he pulled up he felt pain in his lumbar spine.  Patient states the pain has been gradually increasing over the last 24 hours.  He is taking ibuprofen without relief.  Patient states no history of degenerative or chronic back problems. Lumbar strain approximately 10 years ago felt the same as it did today.    The patient has medical history significant for:  Past Medical History  Diagnosis Date  . Asthma   . Allergy   . Eczema   . Wears glasses   . Peptic ulcer disease 11/11    s/p NSAID use, resolved     Past Medical History  Diagnosis Date  . Asthma   . Allergy   . Eczema   . Wears glasses   . Peptic ulcer disease 11/11    s/p NSAID use, resolved    Past Surgical History  Procedure Date  . Bone cyst excision 10/11    mandible cyst excised    Family History  Problem Relation Age of Onset  . Peripheral vascular disease Father   . Heart disease Father     pacemaker  . Heart disease Maternal Grandfather   . Hypertension Neg Hx   .  Hyperlipidemia Neg Hx   . Stroke Neg Hx   . Diabetes Neg Hx     History  Substance Use Topics  . Smoking status: Former Smoker -- 0.2 packs/day for 10 years    Types: Cigars  . Smokeless tobacco: Never Used  . Alcohol Use: 1.0 oz/week    2 drink(s) per week      Review of Systems  Constitutional: Negative for fever and fatigue.  HENT: Negative for neck pain and neck stiffness.   Respiratory: Negative for chest tightness and shortness of breath.   Cardiovascular: Negative for chest pain.  Gastrointestinal: Negative for nausea, vomiting, abdominal pain and diarrhea.  Genitourinary: Negative for dysuria, urgency, frequency and hematuria.  Musculoskeletal: Positive for back pain and gait problem. Negative for joint swelling.  Skin: Negative for rash.  Neurological: Negative for weakness, light-headedness, numbness and headaches.    Allergies  Review of patient's allergies indicates no known allergies.  Home Medications   Current Outpatient Rx  Name Route Sig Dispense Refill  . ALBUTEROL SULFATE HFA 108 (90 BASE) MCG/ACT IN AERS Inhalation Inhale 2 puffs into the lungs every 6 (six) hours as needed. Wheezing    . ASPIRIN EC 325 MG PO TBEC Oral Take 325 mg by mouth daily.    . BECLOMETHASONE DIPROPIONATE 80 MCG/ACT IN AERS Inhalation Inhale 1 puff into the  lungs as needed. 1 Inhaler 5  . IBUPROFEN 200 MG PO TABS Oral Take 200 mg by mouth every 6 (six) hours as needed.      BP 126/84  Pulse 82  Temp 98 F (36.7 C)  Resp 16  SpO2 98%  Physical Exam  Nursing note and vitals reviewed. Constitutional: He appears well-developed and well-nourished. No distress.  HENT:  Head: Normocephalic and atraumatic.  Mouth/Throat: Oropharynx is clear and moist. No oropharyngeal exudate.  Eyes: Conjunctivae are normal. No scleral icterus.  Neck: Normal range of motion. Neck supple.  Cardiovascular: Normal rate, regular rhythm, normal heart sounds and intact distal pulses.  Exam reveals  no gallop and no friction rub.   No murmur heard. Pulmonary/Chest: Effort normal and breath sounds normal. No respiratory distress. He has no wheezes.  Abdominal: Soft. Bowel sounds are normal. He exhibits no mass. There is no tenderness. There is no rebound and no guarding.  Musculoskeletal: Normal range of motion. He exhibits tenderness (paraspinal muscles of the L-spine). He exhibits no edema.       Range of motion of the L-spine, T-spine, C-spine normal Pain with range of motion in the L-spine No pain with palpation of the spinous processes.  Neurological: He is alert.       Speech is clear and goal oriented, follows commands Normal strength in upper and lower extremities bilaterally including dorsiflexion and plantar flexion, strong and equal grip strength Sensation normal to light and sharp touch Moves extremities without ataxia, coordination intact Normal gait Normal heel-shin and balance   Skin: Skin is warm and dry. He is not diaphoretic.  Psychiatric: He has a normal mood and affect.    ED Course  Procedures (including critical care time)  Labs Reviewed - No data to display No results found.   1. Lumbar strain       MDM  Gregory Preston presents with back pain after lifting yesterday.  Suspect lumbar strain. Will obtain a lumbar x-ray to ensure no disc pathology.  X-ray of lumbar spine with evidence of chronic L1-L2 disc degeneration, no pars fracture or significant facet hypertrophy.  I believe this is a lumbar strain secondary to the bending and lifting at the trailer.  I will treat as such with Mobic and Flexeril.  I discussed that disc degeneration with the patient and recommended followup with the primary care physician and/or orthopedics.  Patient states he has an appointment for a regularly scheduled checkup with his primary care physician in 2 weeks. I have encouraged him to discuss this matter with the physician. I have also discussed reasons to return immediately  to the ER.  Patient states understanding.     1. Medications: Mobic, Flexeril 2. Treatment: Rest, ice or heat, back exercises, take medications as prescribed. 3. Follow Up: Primary care or orthopedics if pain persists. With the emergency room if saddle anesthesia, numbness, weakness or incontinence of hers.         Dahlia Client Alie Hardgrove, PA-C 07/14/12 [phone]

## 2012-07-14 ENCOUNTER — Emergency Department (HOSPITAL_COMMUNITY): Payer: 59

## 2012-07-14 MED ORDER — CYCLOBENZAPRINE HCL 10 MG PO TABS: 10.0000 mg | ORAL_TABLET | Freq: Three times a day (TID) | ORAL | Status: AC | PRN

## 2012-07-14 MED ORDER — OXYCODONE-ACETAMINOPHEN 5-325 MG PO TABS
2.0000 | ORAL_TABLET | Freq: Once | ORAL | Status: AC
  Administered 2012-07-14: 2 via ORAL
  Filled 2012-07-14: qty 2

## 2012-07-14 MED ORDER — OXYCODONE-ACETAMINOPHEN 5-325 MG PO TABS
ORAL_TABLET | ORAL | Status: AC
  Filled 2012-07-14: qty 2

## 2012-07-14 MED ORDER — MELOXICAM 15 MG PO TABS: 15.0000 mg | ORAL_TABLET | Freq: Every day | ORAL | Status: DC

## 2012-07-18 NOTE — ED Provider Notes (Signed)
Medical screening examination/treatment/procedure(s) were performed by non-physician practitioner and as supervising physician I was immediately available for consultation/collaboration.  Donnetta Hutching, MD 07/18/12 2047

## 2012-08-02 ENCOUNTER — Encounter: Payer: 59 | Admitting: Medical

## 2012-08-23 ENCOUNTER — Ambulatory Visit (INDEPENDENT_AMBULATORY_CARE_PROVIDER_SITE_OTHER): Payer: 59 | Admitting: Medical

## 2012-08-23 ENCOUNTER — Encounter: Payer: Self-pay | Admitting: Medical

## 2012-08-23 VITALS — BP 116/78 | HR 68 | Resp 16 | Ht 71.0 in | Wt 344.0 lb

## 2012-08-23 DIAGNOSIS — R7301 Impaired fasting glucose: Secondary | ICD-10-CM

## 2012-08-23 DIAGNOSIS — E669 Obesity, unspecified: Secondary | ICD-10-CM

## 2012-08-23 DIAGNOSIS — Z0000 Encounter for general adult medical examination without abnormal findings: Secondary | ICD-10-CM

## 2012-08-23 DIAGNOSIS — J45909 Unspecified asthma, uncomplicated: Secondary | ICD-10-CM

## 2012-08-23 DIAGNOSIS — Z23 Encounter for immunization: Secondary | ICD-10-CM

## 2012-08-23 LAB — POCT URINALYSIS DIPSTICK
Bilirubin, UA: NEGATIVE
Leukocytes, UA: NEGATIVE
Nitrite, UA: NEGATIVE
Protein, UA: NEGATIVE
Urobilinogen, UA: NEGATIVE
pH, UA: 5

## 2012-08-23 LAB — COMPREHENSIVE METABOLIC PANEL
ALT: 24 U/L (ref 0–53)
AST: 19 U/L (ref 0–37)
Alkaline Phosphatase: 60 U/L (ref 39–117)
BUN: 12 mg/dL (ref 6–23)
Chloride: 106 mEq/L (ref 96–112)
Creat: 1.05 mg/dL (ref 0.50–1.35)
Total Bilirubin: 0.6 mg/dL (ref 0.3–1.2)

## 2012-08-23 LAB — CBC WITH DIFFERENTIAL/PLATELET
Basophils Absolute: 0 10*3/uL (ref 0.0–0.1)
Basophils Relative: 0 % (ref 0–1)
Eosinophils Relative: 2 % (ref 0–5)
HCT: 42.2 % (ref 39.0–52.0)
Hemoglobin: 14.4 g/dL (ref 13.0–17.0)
MCH: 30.6 pg (ref 26.0–34.0)
MCHC: 34.1 g/dL (ref 30.0–36.0)
MCV: 89.6 fL (ref 78.0–100.0)
Monocytes Absolute: 0.9 10*3/uL (ref 0.1–1.0)
Monocytes Relative: 7 % (ref 3–12)
RDW: 14.2 % (ref 11.5–15.5)

## 2012-08-23 LAB — LIPID PANEL
LDL Cholesterol: 159 mg/dL — ABNORMAL HIGH (ref 0–99)
Triglycerides: 171 mg/dL — ABNORMAL HIGH (ref <150)
VLDL: 34 mg/dL (ref 0–40)

## 2012-08-23 LAB — HEMOGLOBIN A1C: Hgb A1c MFr Bld: 5.7 % — ABNORMAL HIGH (ref <5.7)

## 2012-08-23 NOTE — Progress Notes (Addendum)
Subjective:   HPI  Gregory Preston is a 36 y.o. male who presents for a complete physical.    Been doing well in general.  Was seen in the ED back in March for right boxer fracture, ended up seeing orthopedics, casted for 1 week, then splinted until resolved.  He was seen more recently in ED for strain, back of left leg with intermittent pain.  Asthma - uses Qvar several days per week, has to use albuterol a few times weekly.    He has lost 15lb of recent.  Trying to get more exercise to lose weight.    The following portions of the patient's history were reviewed and updated as appropriate: allergies, current medications, past family history, past medical history, past social history, past surgical history and problem list.  Past Medical History  Diagnosis Date  . Asthma   . Allergy   . Eczema   . Wears glasses   . Peptic ulcer disease 11/11    s/p NSAID use, resolved  . Hand fracture, right 01/2012   Past Surgical History  Procedure Date  . Bone cyst excision 10/11    mandible cyst excised    Family History  Problem Relation Age of Onset  . Peripheral vascular disease Father   . Heart disease Father     pacemaker  . COPD Father   . Heart disease Maternal Grandfather   . Hypertension Neg Hx   . Hyperlipidemia Neg Hx   . Stroke Neg Hx   . Diabetes Neg Hx   . Cancer Neg Hx    Current Outpatient Prescriptions on File Prior to Visit  Medication Sig Dispense Refill  . albuterol (PROVENTIL HFA;VENTOLIN HFA) 108 (90 BASE) MCG/ACT inhaler Inhale 2 puffs into the lungs every 6 (six) hours as needed. Wheezing      . beclomethasone (QVAR) 80 MCG/ACT inhaler Inhale 1 puff into the lungs as needed.  1 Inhaler  5  . ibuprofen (ADVIL,MOTRIN) 200 MG tablet Take 200 mg by mouth every 6 (six) hours as needed.      Marland Kitchen aspirin EC 325 MG tablet Take 325 mg by mouth daily.      . meloxicam (MOBIC) 15 MG tablet Take 1 tablet (15 mg total) by mouth daily.  15 tablet  0   No Known  Allergies  Review of Systems Constitutional: denies fever, chills, sweats, unexpected weight change, anorexia, fatigue Allergy: negative; denies recent sneezing, itching, congestion Dermatology: denies changing moles, rash, lumps, new worrisome lesions ENT: no runny nose, ear pain, sore throat, hoarseness, sinus pain, teeth pain, tinnitus, hearing loss, epistaxis Cardiology: denies chest pain, palpitations, edema, orthopnea, paroxysmal nocturnal dyspnea Respiratory: denies cough, shortness of breath, dyspnea on exertion, wheezing, hemoptysis Gastroenterology: denies abdominal pain, nausea, vomiting, diarrhea, constipation, blood in stool, changes in bowel movement, dysphagia Hematology: denies bleeding or bruising problems Musculoskeletal: denies arthralgias, myalgias, joint swelling, back pain, neck pain, cramping, gait changes Ophthalmology: denies vision changes, eye redness, itching, discharge Urology: denies dysuria, difficulty urinating, hematuria, urinary frequency, urgency, incontinence Neurology: no headache, weakness, tingling, numbness, speech abnormality, memory loss, falls, dizziness Psychology: denies depressed mood, agitation, sleep problems       Objective:   Physical Exam  General appearance: alert, no distress, WD/WN, black male, obese Skin: unremarkable HEENT: normocephalic, sclerae anicteric, PERRLA, EOMi, nares patent, no discharge or erythema, pharynx normal Oral cavity: MMM, no lesions Neck: supple, no lymphadenopathy, no thyromegaly, no masses, no JVD, no bruits Heart: RRR, normal S1, S2, no murmurs  Lungs: CTA bilaterally, no wheezes, rhonchi, or rales Abdomen: +bs, soft, non tender, non distended, no masses, no hepatomegaly, no splenomegaly Back: non tender Musculoskeletal: nontender, no swelling, no obvious deformity Extremities: no edema, no cyanosis, no clubbing Pulses: 2+ symmetric, upper and lower extremities, normal cap refill Neurological: alert,  oriented x 3, CN2-12 intact, strength normal upper extremities and lower extremities, sensation normal throughout, DTRs 2+ throughout, no cerebellar signs, gait normal GU: nontender, no masses, no hernias, no lesions, normal male external genitalia Psychiatric: normal affect, behavior normal, pleasant  Rectal: deferred  Assessment :    Encounter Diagnoses  Name Primary?  . Routine general medical examination at a health care facility Yes  . Obesity   . Impaired fasting blood sugar   . Asthma   . Need for pneumococcal vaccination      Plan:   Advised healthy lifestyle, healthy diet, regular exercise, discussed vaccines.  Obesity - advised he c/t efforts to lose weight  Impaired fasting glucose - labs today  Asthma - advised Qvar daily consistently.   Albuterol prn.  Will send for PFTs.  Pneumococcal vaccine, VIS, and counseling given.

## 2012-08-27 ENCOUNTER — Telehealth: Payer: Self-pay | Admitting: Family Medicine

## 2012-08-27 NOTE — Telephone Encounter (Signed)
PATIENT IS AWARE OF HIS APPOINTMENT FOR A PFT ON 09/04/12 @ 100 PM AT [facility]. CLS

## 2012-08-28 ENCOUNTER — Encounter: Payer: Self-pay | Admitting: Medical

## 2012-08-30 ENCOUNTER — Encounter (HOSPITAL_COMMUNITY): Payer: Self-pay | Admitting: *Deleted

## 2012-08-30 ENCOUNTER — Emergency Department (HOSPITAL_COMMUNITY): Payer: 59

## 2012-08-30 ENCOUNTER — Emergency Department (HOSPITAL_COMMUNITY)
Admission: EM | Admit: 2012-08-30 | Discharge: 2012-08-30 | Disposition: A | Discharge status: Home / Self Care | Payer: 59 | Attending: Emergency Medicine | Admitting: Emergency Medicine

## 2012-08-30 DIAGNOSIS — R0789 Other chest pain: Secondary | ICD-10-CM

## 2012-08-30 DIAGNOSIS — Z87891 Personal history of nicotine dependence: Secondary | ICD-10-CM | POA: Insufficient documentation

## 2012-08-30 MED ORDER — IPRATROPIUM BROMIDE 0.02 % IN SOLN
0.5000 mg | Freq: Once | RESPIRATORY_TRACT | Status: DC
  Filled 2012-08-30: qty 2.5

## 2012-08-30 MED ORDER — ALBUTEROL SULFATE (5 MG/ML) 0.5% IN NEBU
5.0000 mg | INHALATION_SOLUTION | Freq: Once | RESPIRATORY_TRACT | Status: DC
  Filled 2012-08-30: qty 1

## 2012-08-30 MED ORDER — GI COCKTAIL ~~LOC~~
30.0000 mL | Freq: Once | ORAL | Status: AC
  Administered 2012-08-30: 30 mL via ORAL
  Filled 2012-08-30: qty 30

## 2012-08-30 MED ORDER — NAPROXEN 500 MG PO TABS: 500.0000 mg | ORAL_TABLET | Freq: Two times a day (BID) | ORAL | Status: DC

## 2012-08-30 MED ORDER — CYCLOBENZAPRINE HCL 10 MG PO TABS: 10.0000 mg | ORAL_TABLET | Freq: Three times a day (TID) | ORAL | Status: DC | PRN

## 2012-08-30 MED ORDER — KETOROLAC TROMETHAMINE 60 MG/2ML IM SOLN
60.0000 mg | Freq: Once | INTRAMUSCULAR | Status: AC
  Administered 2012-08-30: 60 mg via INTRAMUSCULAR
  Filled 2012-08-30: qty 2

## 2012-08-30 NOTE — ED Provider Notes (Signed)
Medical screening examination/treatment/procedure(s) were performed by non-physician practitioner and as supervising physician I was immediately available for consultation/collaboration.  Emaley Applin, MD 08/30/12 0710 

## 2012-08-30 NOTE — ED Notes (Signed)
EKG old and new given to EDP, Kohut, MD. 

## 2012-08-30 NOTE — ED Notes (Signed)
Pt c/o right upper chest pain since Tuesday; constant; increases with movement; worse tonight radiating to back; increased pain with respirations

## 2012-08-30 NOTE — ED Provider Notes (Signed)
History     CSN: [id number]  Arrival date & time 08/30/12  0502   First MD Initiated Contact with Patient 08/30/12 [phone]      Chief Complaint  Patient presents with  . Chest Pain   HPI  History provided by the patient. Patient is a 36 year old male with history of asthma seasonal allergies who presents with complaints of sharp right chest pain for the past 4 days. Patient states symptoms began last Tuesday night and have been persistent. Pain radiates straight through to his back area. Pain is worse with certain movements such as turning the neck to the left and movements of the arm. He denies any strenuous activity or trauma. He denies any pleuritic pain. He denies any recent cough or hemoptysis. Patient denies any associated shortness of breath, heart palpitations, nausea or diaphoresis. Patient has taken occasional ibuprofen without any relief. He denies any fever, chills or sweats. Patient also    Past Medical History  Diagnosis Date  . Asthma   . Allergy   . Eczema   . Wears glasses   . Peptic ulcer disease 11/11    s/p NSAID use, resolved  . Hand fracture, right 01/2012    Past Surgical History  Procedure Date  . Bone cyst excision 10/11    mandible cyst excised    Family History  Problem Relation Age of Onset  . Peripheral vascular disease Father   . Heart disease Father     pacemaker  . COPD Father   . Heart disease Maternal Grandfather   . Hypertension Neg Hx   . Hyperlipidemia Neg Hx   . Stroke Neg Hx   . Diabetes Neg Hx   . Cancer Neg Hx     History  Substance Use Topics  . Smoking status: Former Smoker -- 0.2 packs/day for 10 years    Types: Cigars  . Smokeless tobacco: Never Used  . Alcohol Use: 1.0 oz/week    2 drink(s) per week      Review of Systems  Constitutional: Negative for fever, chills, diaphoresis, appetite change and unexpected weight change.  HENT: Negative for neck pain.   Respiratory: Negative for cough, shortness of breath and  wheezing.   Cardiovascular: Positive for chest pain. Negative for palpitations and leg swelling.  Gastrointestinal: Negative for nausea, vomiting, abdominal pain, diarrhea and constipation.  Musculoskeletal: Negative for myalgias.  Skin: Negative for rash.  Neurological: Negative for dizziness, light-headedness and headaches.    Allergies  Review of patient's allergies indicates no known allergies.  Home Medications   Current Outpatient Rx  Name Route Sig Dispense Refill  . ALBUTEROL SULFATE HFA 108 (90 BASE) MCG/ACT IN AERS Inhalation Inhale 2 puffs into the lungs every 6 (six) hours as needed. Wheezing    . ASPIRIN EC 325 MG PO TBEC Oral Take 325 mg by mouth daily.    . BECLOMETHASONE DIPROPIONATE 80 MCG/ACT IN AERS Inhalation Inhale 1 puff into the lungs as needed. 1 Inhaler 5  . IBUPROFEN 200 MG PO TABS Oral Take 200 mg by mouth every 6 (six) hours as needed.    . MELOXICAM 15 MG PO TABS Oral Take 1 tablet (15 mg total) by mouth daily. 15 tablet 0    BP 127/76  Pulse 89  Temp 99.2 F (37.3 C) (Oral)  Resp 16  SpO2 98%  Physical Exam  Nursing note and vitals reviewed. Constitutional: He is oriented to person, place, and time. He appears well-developed and well-nourished. No distress.  HENT:  Head: Normocephalic.  Neck: Normal range of motion. Neck supple.  Cardiovascular: Normal rate and regular rhythm.   No murmur heard. Pulmonary/Chest: Effort normal and breath sounds normal. No stridor. No respiratory distress. He has no wheezes. He has no rales. He exhibits no tenderness.       Patient does report right mid upper chest pain with rotation of head to the left. No pain with palpation. No deformity or step-offs. No crepitus.  Abdominal: Soft. There is no tenderness. There is no rigidity, no rebound, no guarding and negative Murphy's sign.  Musculoskeletal: Normal range of motion. He exhibits no edema and no tenderness.  Neurological: He is alert and oriented to person,  place, and time.  Skin: Skin is warm. No rash noted.  Psychiatric: He has a normal mood and affect. His behavior is normal.    ED Course  Procedures   Dg Chest 2 View  September 24, 2012  *RADIOLOGY REPORT*  Clinical Data: 36 year old male with right-side chest pain times 4 days.  CHEST - 2 VIEW  Comparison: 10/16/2010.  Findings: Stable lung volumes.  Cardiac size at the upper limits of normal or mildly enlarged and stable. Other mediastinal contours are within normal limits.  Visualized tracheal air column is within normal limits.  No pneumothorax or pulmonary edema.  No pleural effusion or confluent pulmonary opacity. No acute osseous abnormality identified.  IMPRESSION: No acute cardiopulmonary abnormality.   Original Report Authenticated By: Harley Hallmark, M.D.      1. Musculoskeletal chest pain       MDM  5:20AM patient seen and evaluated. Patient appears well in no acute distress. He has normal respirations and O2 sats and normal heart rate. Patient is PERC negative.   Patient is low risk for ACS. Patient has atypical sharp and persistent right chest pain that has been persistent for 4 days and worse with certain movements. No other concerning associated symptoms. Patient with no history of hypertension, hyper cholesterolemia or diabetes. Patient has never smoked. No significant family history of early cardiac death.        Date: 09/24/12  Rate: 91  Rhythm: normal sinus rhythm  QRS Axis: normal  Intervals: normal  ST/T Wave abnormalities: normal  Conduction Disutrbances:none  Narrative Interpretation:   Old EKG Reviewed: unchanged from 10/16/2010          Angus Seller, PA 2012/09/24 [phone]

## 2012-09-23 ENCOUNTER — Ambulatory Visit (INDEPENDENT_AMBULATORY_CARE_PROVIDER_SITE_OTHER): Payer: 59 | Admitting: Internal Medicine

## 2012-09-23 DIAGNOSIS — J45909 Unspecified asthma, uncomplicated: Secondary | ICD-10-CM

## 2012-09-23 LAB — PULMONARY FUNCTION TEST

## 2012-09-23 NOTE — Progress Notes (Signed)
PFT done today. 

## 2012-10-31 ENCOUNTER — Telehealth: Payer: Self-pay | Admitting: Medical

## 2012-11-01 ENCOUNTER — Other Ambulatory Visit: Payer: Self-pay | Admitting: Medical

## 2012-11-01 MED ORDER — ALBUTEROL SULFATE HFA 108 (90 BASE) MCG/ACT IN AERS: 2.0000 | INHALATION_SPRAY | Freq: Four times a day (QID) | RESPIRATORY_TRACT | Status: DC | PRN

## 2012-11-01 NOTE — Telephone Encounter (Signed)
LM

## 2012-12-25 ENCOUNTER — Telehealth: Payer: Self-pay | Admitting: Medical

## 2012-12-25 MED ORDER — ALBUTEROL SULFATE HFA 108 (90 BASE) MCG/ACT IN AERS: 2.0000 | INHALATION_SPRAY | Freq: Four times a day (QID) | RESPIRATORY_TRACT | Status: DC | PRN

## 2012-12-25 NOTE — Telephone Encounter (Signed)
Go ahead and refill albuterol inhaler with 1 refill. If having to use inhaler more than 2 x per week at night or more than 3 x per week in the day, then needs to start back on Qvar daily if not already doing so.  Let me know

## 2012-12-25 NOTE — Telephone Encounter (Signed)
Is this okay to refill? Patient had a physical on 08/2012 and PFT on 09/2012

## 2012-12-25 NOTE — Telephone Encounter (Signed)
I sent the patient RX refill to the pharmacy. CLS

## 2013-01-27 ENCOUNTER — Other Ambulatory Visit: Payer: Self-pay | Admitting: Medical

## 2013-01-27 ENCOUNTER — Telehealth: Payer: Self-pay | Admitting: Medical

## 2013-01-27 MED ORDER — ALBUTEROL SULFATE HFA 108 (90 BASE) MCG/ACT IN AERS: 2.0000 | INHALATION_SPRAY | Freq: Four times a day (QID) | RESPIRATORY_TRACT | Status: DC | PRN

## 2013-01-27 MED ORDER — BECLOMETHASONE DIPROPIONATE 80 MCG/ACT IN AERS: 1.0000 | INHALATION_SPRAY | Freq: Two times a day (BID) | RESPIRATORY_TRACT | Status: DC

## 2013-01-31 NOTE — Telephone Encounter (Signed)
DONE

## 2013-02-27 ENCOUNTER — Ambulatory Visit (INDEPENDENT_AMBULATORY_CARE_PROVIDER_SITE_OTHER): Payer: 59 | Admitting: Family Medicine

## 2013-02-27 ENCOUNTER — Encounter: Payer: Self-pay | Admitting: Family Medicine

## 2013-02-27 VITALS — BP 130/80 | HR 78 | Wt 363.0 lb

## 2013-02-27 DIAGNOSIS — M542 Cervicalgia: Secondary | ICD-10-CM

## 2013-02-27 NOTE — Patient Instructions (Signed)
Heat for 20 minutes 3 times per day. Moist heat tends to penetrate better. You can take 800 mg of ibuprofen 3 times per day. After you get done  with the  heat then do some gentle stretching. If your symptoms get worse or you started getting more pain down your arm then let me know.

## 2013-02-27 NOTE — Progress Notes (Signed)
Subjective:    Patient ID: Gregory Preston, male    DOB: October 08, 1976, 37 y.o.   MRN: [id number]  HPI Yesterday while driving, he turned his head to the left and then noted distal neck pain and then radiation down the right arm that started today. He did try ice and ibuprofen. No numbness, tingling or weakness. He does note increased pain when he rotates his neck to the right.   Review of Systems     Objective:   Physical Exam Alert and in no distress. Slight pain on palpation over the lower cervical and upper thoracic area. Good motion of his neck. Normal motor, sensory and DTRs of his arms.       Assessment & Plan:  Neck pain I explained that I saw no evidence of pinched nerve. Recommended conservative care with heat, stretching and anti-inflammatory. Also discussed possible chiropractic referral as an option. Discussed the fact that if this gets worse, he is to let me know

## 2013-05-24 ENCOUNTER — Other Ambulatory Visit: Payer: Self-pay | Admitting: Family Medicine

## 2013-05-24 ENCOUNTER — Other Ambulatory Visit: Payer: Self-pay | Admitting: Medical

## 2013-05-24 MED ORDER — ALBUTEROL SULFATE HFA 108 (90 BASE) MCG/ACT IN AERS: 2.0000 | INHALATION_SPRAY | Freq: Four times a day (QID) | RESPIRATORY_TRACT | Status: DC | PRN

## 2013-07-25 ENCOUNTER — Other Ambulatory Visit: Payer: Self-pay | Admitting: Family Medicine

## 2013-08-08 ENCOUNTER — Ambulatory Visit: Payer: 59 | Admitting: Family Medicine

## 2013-08-22 ENCOUNTER — Other Ambulatory Visit: Payer: Self-pay | Admitting: Family Medicine

## 2013-09-18 ENCOUNTER — Other Ambulatory Visit: Payer: Self-pay | Admitting: Family Medicine

## 2013-10-05 IMAGING — CR DG LUMBAR SPINE COMPLETE 4+V
5 series · 5 of 5 positions shown · non-contrast
Comparison: Abdominal series 10/16/2010.

CLINICAL DATA: 35-year-old male with pain radiating to the lower
extremities.

LUMBAR SPINE - COMPLETE 4+ VIEW

[t lumbar spine ap]
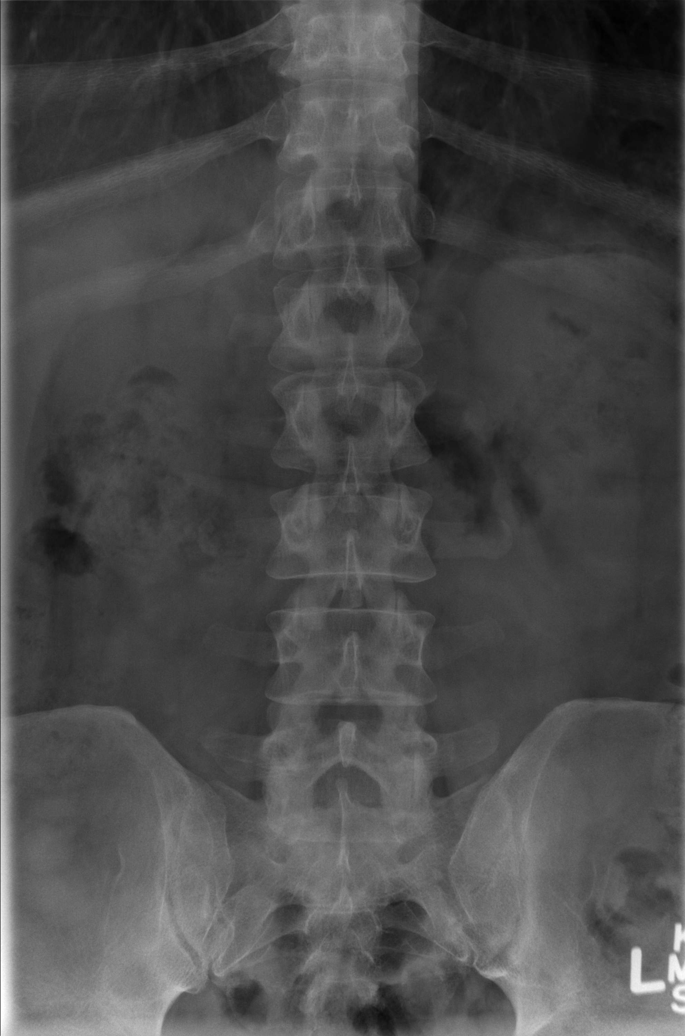

[t lumbar spine obl (1 of 2)]
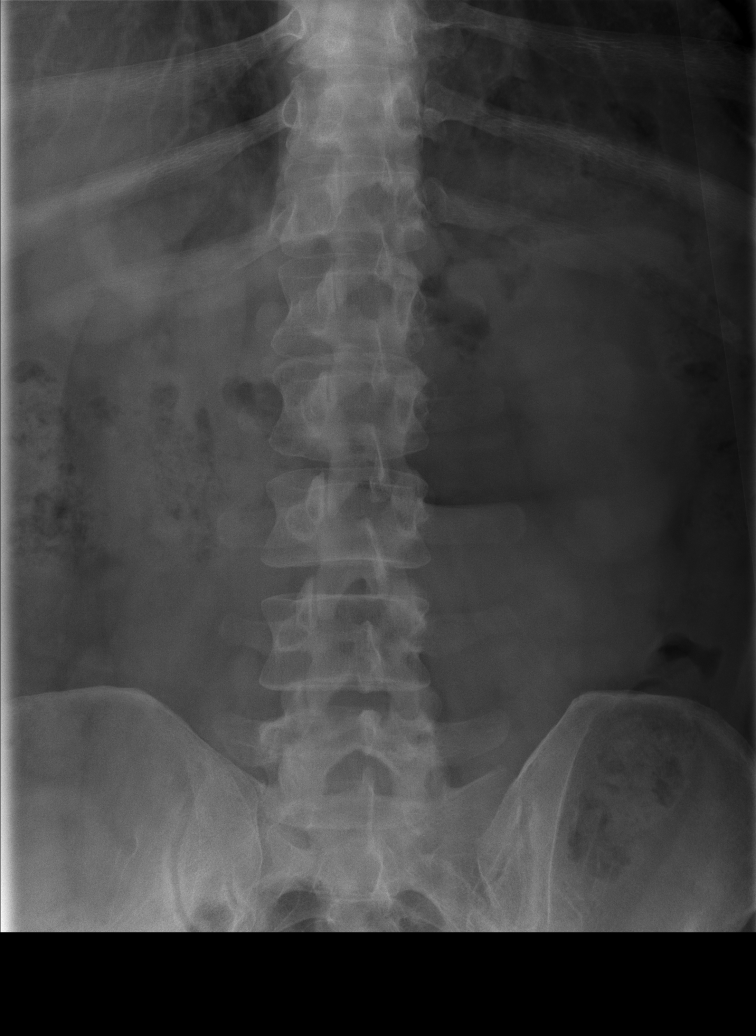

[t lumbar spine obl (2 of 2)]
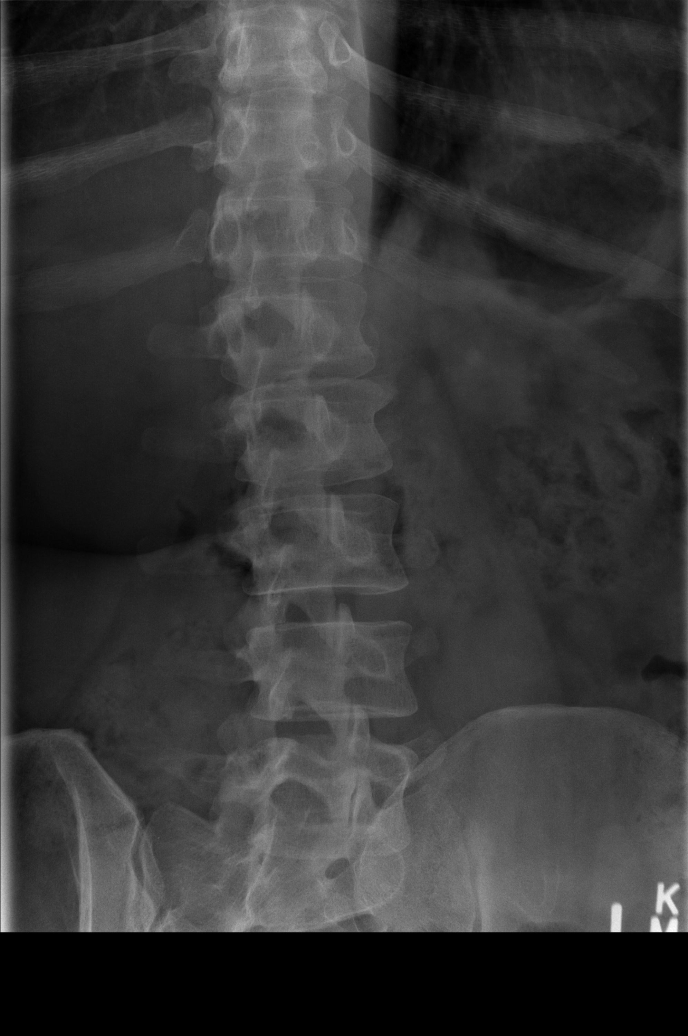

[t lumbar spine lat]
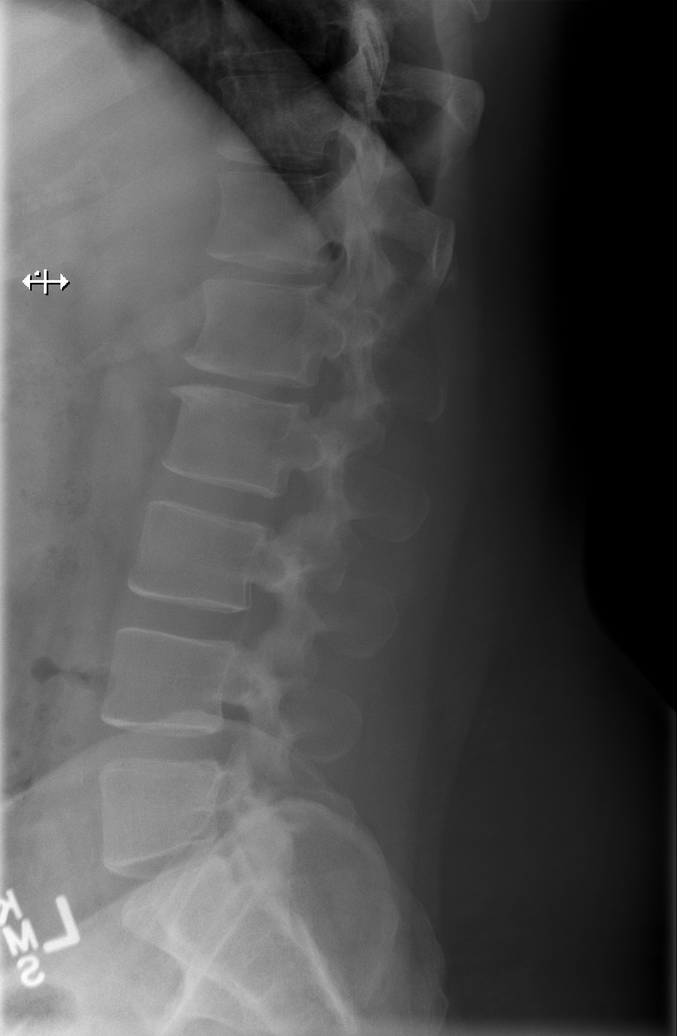

[t lumbar l-5 s-1 spot]
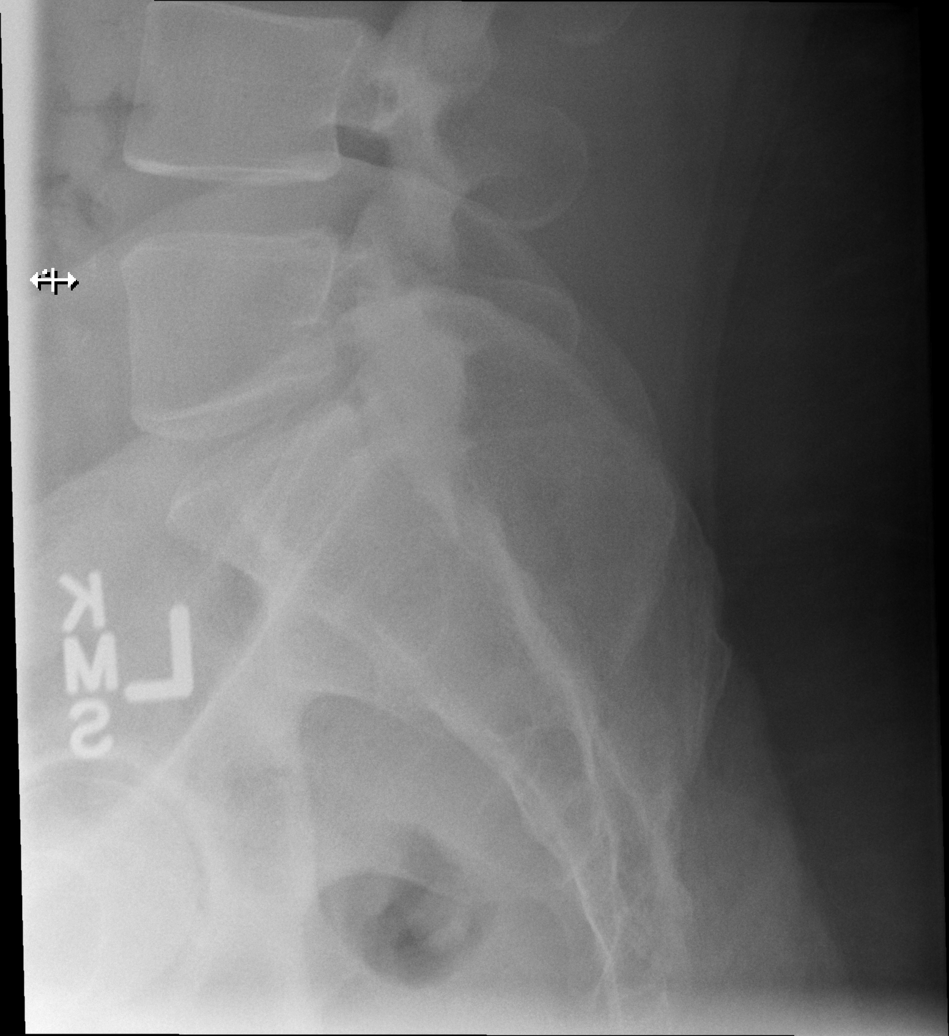

[5 of 5 positions shown; findings below may reference images not displayed]

FINDINGS: Normal lumbar segmentation.  Vertebral height alignment
within normal limits.  Mild disc space loss at L1-L2 associated
with degenerative spurring.  No pars fracture or significant facet
hypertrophy.  Negative sacral ala and SI joints.
IMPRESSION: Evidence of chronic L1-L2 disc degeneration.

## 2013-10-09 ENCOUNTER — Other Ambulatory Visit: Payer: Self-pay | Admitting: Family Medicine

## 2013-11-03 ENCOUNTER — Other Ambulatory Visit: Payer: Self-pay | Admitting: Family Medicine

## 2013-11-03 MED ORDER — ALBUTEROL SULFATE HFA 108 (90 BASE) MCG/ACT IN AERS: INHALATION_SPRAY | RESPIRATORY_TRACT | Status: DC

## 2013-11-03 NOTE — Telephone Encounter (Signed)
IS THIS OK 

## 2014-02-12 ENCOUNTER — Telehealth: Payer: Self-pay | Admitting: Family Medicine

## 2014-02-12 MED ORDER — ALBUTEROL SULFATE HFA 108 (90 BASE) MCG/ACT IN AERS: 2.0000 | INHALATION_SPRAY | Freq: Four times a day (QID) | RESPIRATORY_TRACT | Status: DC | PRN

## 2014-02-12 NOTE — Telephone Encounter (Signed)
Attempt was made to call in the pro-air [facility]

## 2014-03-18 ENCOUNTER — Telehealth: Payer: Self-pay | Admitting: Family Medicine

## 2014-03-18 MED ORDER — ALBUTEROL SULFATE HFA 108 (90 BASE) MCG/ACT IN AERS: 1.0000 | INHALATION_SPRAY | Freq: Four times a day (QID) | RESPIRATORY_TRACT | Status: DC | PRN

## 2014-03-18 NOTE — Telephone Encounter (Signed)
Pro air was called into pharmacy & went thru ins.  Pt & wife notified & apologized that this wasn't taken care of last month.

## 2014-03-19 ENCOUNTER — Other Ambulatory Visit: Payer: Self-pay | Admitting: Family Medicine

## 2014-06-08 ENCOUNTER — Other Ambulatory Visit: Payer: Self-pay | Admitting: Family Medicine

## 2014-09-03 ENCOUNTER — Telehealth: Payer: Self-pay

## 2014-09-03 ENCOUNTER — Other Ambulatory Visit: Payer: Self-pay | Admitting: Family Medicine

## 2014-09-03 NOTE — Telephone Encounter (Signed)
I CALLED PT TO INFORM HIM HE NEEDED AN APPOINTMENT BEFORE I COULD REFILL HIS PROAIR PT THEN GOT MAD GAVE PHONE TO WIFE DONNA I EXPLAINED THAT HE HAS NOT BEEN SEEN SINCE 1 YEAR AND 6 MONTHS AND HE NEEDS TO MAKE APPOINTMENT I ALSO EXPLAINED IF THEY NO SHOW OR CANCEL THIS APPOINTMENT I COULD NOT REFILL UNLESS HE HAS BEEN SEEN DONNA VERBALIZED UNDERSTANDING I HAVE OKAYED MED WITH NO REFILLS

## 2014-09-23 ENCOUNTER — Ambulatory Visit (INDEPENDENT_AMBULATORY_CARE_PROVIDER_SITE_OTHER): Payer: 59 | Admitting: Family Medicine

## 2014-09-23 ENCOUNTER — Encounter: Payer: Self-pay | Admitting: Family Medicine

## 2014-09-23 VITALS — BP 120/80 | HR 94 | Wt 352.0 lb

## 2014-09-23 DIAGNOSIS — J4530 Mild persistent asthma, uncomplicated: Secondary | ICD-10-CM

## 2014-09-23 DIAGNOSIS — R7301 Impaired fasting glucose: Secondary | ICD-10-CM

## 2014-09-23 MED ORDER — BECLOMETHASONE DIPROPIONATE 80 MCG/ACT IN AERS: 1.0000 | INHALATION_SPRAY | Freq: Two times a day (BID) | RESPIRATORY_TRACT | Status: DC

## 2014-09-23 NOTE — Progress Notes (Signed)
Subjective:    Patient ID: Gregory Preston, male    DOB: Jun 26, 1976, 38 y.o.   MRN: [id number]  HPI He is here for a recheck. He has been out of his Qvar for several months. He notes that he has had the use his inhaler mainly at night usually twice to help with wheezing. He rarely uses this during the day. He does note weight gain especially since he has been married. He admits to drinking sodas on a regular basis.does have a history of impaired glucose tolerance.  Review of Systems     Objective:   Physical Exam alert and in no distress. Tympanic membranes and canals are normal. Throat is clear. Tonsils are normal. Neck is supple without adenopathy or thyromegaly. Cardiac exam shows a regular sinus rhythm without murmurs or gallops. Lungs are clear to auscultation.        Assessment & Plan:  Asthma in adult, mild persistent, uncomplicated - Plan: beclomethasone (QVAR) 80 MCG/ACT inhaler  Morbid obesity  Impaired fasting blood sugar I will place him back on Qvar. Explained that he needs to use this regularly and only use the uterus on an as-needed basis. Discussed weight loss with him in regard to dietary modification as he does keep himself quite busy with work. Discussed possible nutrition referral but at this time it was not made. Discussed setting a waist size as a goal rather than wait.

## 2014-09-23 NOTE — Patient Instructions (Addendum)
Use the Qvar regularly. Back on your sodas and drink water Cut back on "white food" bread, rice, fossa, potatoes and sugar If you have to use the rescue inhaler more than twice a month at night or twice week while you're awake I need to know

## 2014-10-05 ENCOUNTER — Other Ambulatory Visit: Payer: Self-pay | Admitting: Family Medicine

## 2015-01-21 ENCOUNTER — Other Ambulatory Visit: Payer: Self-pay | Admitting: Family Medicine

## 2015-04-08 ENCOUNTER — Telehealth: Payer: Self-pay | Admitting: Internal Medicine

## 2015-04-08 DIAGNOSIS — J4530 Mild persistent asthma, uncomplicated: Secondary | ICD-10-CM

## 2015-04-08 MED ORDER — BECLOMETHASONE DIPROPIONATE 80 MCG/ACT IN AERS: 1.0000 | INHALATION_SPRAY | Freq: Two times a day (BID) | RESPIRATORY_TRACT | Status: DC

## 2015-04-08 NOTE — Telephone Encounter (Signed)
Request for a 90 day for qvar to [facility]

## 2015-05-10 ENCOUNTER — Other Ambulatory Visit: Payer: Self-pay | Admitting: Family Medicine

## 2015-05-10 ENCOUNTER — Telehealth: Payer: Self-pay | Admitting: Medical

## 2015-05-10 NOTE — Telephone Encounter (Signed)
Wife called for refill & was already filled and to pay on balance

## 2015-07-20 ENCOUNTER — Other Ambulatory Visit: Payer: Self-pay | Admitting: Family Medicine

## 2015-09-13 ENCOUNTER — Other Ambulatory Visit: Payer: Self-pay | Admitting: Family Medicine

## 2015-09-13 NOTE — Telephone Encounter (Signed)
Is this ok to refill?  

## 2015-11-15 ENCOUNTER — Other Ambulatory Visit: Payer: Self-pay | Admitting: Family Medicine

## 2015-11-16 ENCOUNTER — Telehealth: Payer: Self-pay | Admitting: Medical

## 2015-11-16 MED ORDER — ALBUTEROL SULFATE HFA 108 (90 BASE) MCG/ACT IN AERS: INHALATION_SPRAY | RESPIRATORY_TRACT | Status: DC

## 2015-11-16 NOTE — Telephone Encounter (Signed)
Needs refill on Proair   [facility]

## 2015-11-16 NOTE — Telephone Encounter (Signed)
Pt scheduled an appt for Janaury 11th for med check

## 2015-11-16 NOTE — Telephone Encounter (Signed)
Last refill request was declined stating "no longer under prescriber care" done by you. So he would not get a refill then correct?

## 2015-12-01 ENCOUNTER — Encounter: Payer: Self-pay | Admitting: Family Medicine

## 2015-12-01 ENCOUNTER — Ambulatory Visit (INDEPENDENT_AMBULATORY_CARE_PROVIDER_SITE_OTHER): Payer: 59 | Admitting: Family Medicine

## 2015-12-01 VITALS — BP 138/90 | HR 101 | Ht 71.0 in | Wt 360.4 lb

## 2015-12-01 DIAGNOSIS — J4530 Mild persistent asthma, uncomplicated: Secondary | ICD-10-CM | POA: Diagnosis not present

## 2015-12-01 DIAGNOSIS — R7301 Impaired fasting glucose: Secondary | ICD-10-CM | POA: Diagnosis not present

## 2015-12-01 DIAGNOSIS — J069 Acute upper respiratory infection, unspecified: Secondary | ICD-10-CM

## 2015-12-01 LAB — COMPREHENSIVE METABOLIC PANEL
ALBUMIN: 4.4 g/dL (ref 3.6–5.1)
ALT: 27 U/L (ref 9–46)
AST: 18 U/L (ref 10–40)
Alkaline Phosphatase: 63 U/L (ref 40–115)
BUN: 16 mg/dL (ref 7–25)
CHLORIDE: 105 mmol/L (ref 98–110)
CO2: 25 mmol/L (ref 20–31)
Calcium: 9.7 mg/dL (ref 8.6–10.3)
Creat: 1.18 mg/dL (ref 0.60–1.35)
Glucose, Bld: 88 mg/dL (ref 65–99)
POTASSIUM: 4.6 mmol/L (ref 3.5–5.3)
Sodium: 138 mmol/L (ref 135–146)
TOTAL PROTEIN: 7 g/dL (ref 6.1–8.1)
Total Bilirubin: 0.4 mg/dL (ref 0.2–1.2)

## 2015-12-01 LAB — CBC WITH DIFFERENTIAL/PLATELET
Basophils Absolute: 0 10*3/uL (ref 0.0–0.1)
Basophils Relative: 0 % (ref 0–1)
EOS ABS: 0.4 10*3/uL (ref 0.0–0.7)
Eosinophils Relative: 4 % (ref 0–5)
HCT: 43.8 % (ref 39.0–52.0)
HEMOGLOBIN: 14.9 g/dL (ref 13.0–17.0)
LYMPHS ABS: 2.4 10*3/uL (ref 0.7–4.0)
Lymphocytes Relative: 23 % (ref 12–46)
MCH: 30.3 pg (ref 26.0–34.0)
MCHC: 34 g/dL (ref 30.0–36.0)
MCV: 89 fL (ref 78.0–100.0)
MONO ABS: 0.8 10*3/uL (ref 0.1–1.0)
MONOS PCT: 8 % (ref 3–12)
MPV: 10.7 fL (ref 8.6–12.4)
NEUTROS ABS: 6.8 10*3/uL (ref 1.7–7.7)
NEUTROS PCT: 65 % (ref 43–77)
Platelets: 254 10*3/uL (ref 150–400)
RBC: 4.92 MIL/uL (ref 4.22–5.81)
RDW: 13.4 % (ref 11.5–15.5)
WBC: 10.4 10*3/uL (ref 4.0–10.5)

## 2015-12-01 LAB — LIPID PANEL
CHOL/HDL RATIO: 4 ratio (ref <=5.0)
CHOLESTEROL: 190 mg/dL (ref 125–200)
HDL: 47 mg/dL (ref >=40)
LDL Cholesterol: 112 mg/dL (ref <130)
TRIGLYCERIDES: 154 mg/dL — AB (ref <150)
VLDL: 31 mg/dL — AB (ref <30)

## 2015-12-01 MED ORDER — ALBUTEROL SULFATE HFA 108 (90 BASE) MCG/ACT IN AERS: INHALATION_SPRAY | RESPIRATORY_TRACT | Status: DC

## 2015-12-01 MED ORDER — BECLOMETHASONE DIPROPIONATE 80 MCG/ACT IN AERS: 1.0000 | INHALATION_SPRAY | Freq: Two times a day (BID) | RESPIRATORY_TRACT | Status: DC

## 2015-12-01 NOTE — Progress Notes (Signed)
Subjective:    Patient ID: Gregory Preston, male    DOB: 1976-01-30, 40 y.o.   MRN: [id number]  HPI He is here for medication check. He also complains of a five-day history this started with chest congestion followed several days after that with nasal congestion, rhinorrhea. No PND, sore throat, earache, fever or chills. He has been using Tylenol Sinus for this. He has an underlying history of asthma however he ran out of Qvar and did not call for a refill. He has been using albuterol several times per day. He also states that he has poor eating habits sometimes having only one meal per day. 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   Review of Systems     Objective:   Physical Exam Alert and in no distress. Tympanic membranes and canals are normal. Pharyngeal area is normal. Neck is supple without adenopathy or thyromegaly. Cardiac exam shows a regular sinus rhythm without murmurs or gallops. Lungs are clear to auscultation.        Assessment & Plan:  Asthma in adult, mild persistent, uncomplicated - Plan: albuterol (PROAIR HFA) 108 (90 Base) MCG/ACT inhaler, beclomethasone (QVAR) 80 MCG/ACT inhaler, DISCONTINUED: beclomethasone (QVAR) 80 MCG/ACT inhaler  Impaired fasting blood sugar - Plan: Amb ref to Medical Nutrition Therapy-MNT, CBC with Differential/Platelet, Comprehensive metabolic panel, Lipid panel  Morbid obesity due to excess calories (HCC) - Plan: Amb ref to Medical Nutrition Therapy-MNT, CBC with Differential/Platelet, Comprehensive metabolic panel, Lipid panel  Acute URI Conservative care for the URI and call me next week if he still having difficulty for possible antibiotic. Explained the proper use of albuterol. Explained that he only should be using that twice per week during the day and twice per month at night. Given information to get Qvar and to get a discount card. Also  discussed weight loss in regard to carbohydrates and exercise. He will be referred to nutrition. Also discussed his him Impaired fasting glucose and risk for diabetes.

## 2015-12-01 NOTE — Patient Instructions (Addendum)
You can also use Afrin nasal spray but only at night If you don't feel better by Monday, give me a call.

## 2015-12-13 ENCOUNTER — Telehealth: Payer: Self-pay

## 2015-12-13 MED ORDER — AMOXICILLIN 875 MG PO TABS: 875.0000 mg | ORAL_TABLET | Freq: Two times a day (BID) | ORAL | Status: DC

## 2015-12-13 NOTE — Telephone Encounter (Signed)
Pt was here 12/01/15. Pt wife called on behalf of him saying he is not getting any better and it is in his chest now. They would [facility]

## 2015-12-13 NOTE — Telephone Encounter (Signed)
Let them know that I called medication in

## 2015-12-13 NOTE — Telephone Encounter (Signed)
Pt was notified.  

## 2016-03-26 ENCOUNTER — Other Ambulatory Visit: Payer: Self-pay | Admitting: Family Medicine

## 2016-06-13 ENCOUNTER — Other Ambulatory Visit: Payer: Self-pay | Admitting: Medical

## 2016-06-13 ENCOUNTER — Other Ambulatory Visit: Payer: Self-pay | Admitting: Family Medicine

## 2016-06-13 ENCOUNTER — Telehealth: Payer: Self-pay | Admitting: Medical

## 2016-06-13 DIAGNOSIS — J4530 Mild persistent asthma, uncomplicated: Secondary | ICD-10-CM

## 2016-06-13 MED ORDER — ALBUTEROL SULFATE HFA 108 (90 BASE) MCG/ACT IN AERS: INHALATION_SPRAY | RESPIRATORY_TRACT | 0 refills | Status: DC

## 2016-06-13 NOTE — Telephone Encounter (Signed)
Pt needs refill on [facility]air to [facility]

## 2016-08-03 ENCOUNTER — Other Ambulatory Visit: Payer: Self-pay | Admitting: Medical

## 2016-08-03 ENCOUNTER — Telehealth: Payer: Self-pay | Admitting: Internal Medicine

## 2016-08-03 ENCOUNTER — Other Ambulatory Visit: Payer: Self-pay | Admitting: Family Medicine

## 2016-08-03 MED ORDER — FLUTICASONE FUROATE-VILANTEROL 100-25 MCG/INH IN AEPB: 1.0000 | INHALATION_SPRAY | Freq: Every day | RESPIRATORY_TRACT | 1 refills | Status: DC

## 2016-08-03 MED ORDER — ALBUTEROL SULFATE HFA 108 (90 BASE) MCG/ACT IN AERS: INHALATION_SPRAY | RESPIRATORY_TRACT | 1 refills | Status: DC

## 2016-08-03 NOTE — Telephone Encounter (Signed)
I refilled Proair/Albuterol.  I sent Breo as an alternate since Qvar was too expensive.  If this is also expensive, then he needs to call insurer to find out their preferred maintenance asthma medications.

## 2016-08-03 NOTE — Telephone Encounter (Signed)
Pt's wife called asking that pt get a refill on albuterol inhaler and then switching to generic qvar as the qvar was too expensive. Send to [facility]

## 2017-01-26 ENCOUNTER — Other Ambulatory Visit: Payer: Self-pay | Admitting: Medical

## 2017-01-26 ENCOUNTER — Telehealth: Payer: Self-pay | Admitting: Family Medicine

## 2017-01-26 NOTE — Telephone Encounter (Signed)
done

## 2017-01-26 NOTE — Telephone Encounter (Signed)
pls refill

## 2017-01-26 NOTE — Telephone Encounter (Signed)
Wife made CPE appt for pt on 02/28/17. Pt is completely out of ProAir and would like a refill on this med to last until the 4/11 appt

## 2017-02-28 ENCOUNTER — Encounter: Payer: 59 | Admitting: Family Medicine

## 2017-04-11 ENCOUNTER — Encounter: Payer: Self-pay | Admitting: Family Medicine

## 2017-04-11 ENCOUNTER — Ambulatory Visit (INDEPENDENT_AMBULATORY_CARE_PROVIDER_SITE_OTHER): Payer: BLUE CROSS/BLUE SHIELD | Admitting: Family Medicine

## 2017-04-11 VITALS — BP 120/80 | HR 80 | Ht 71.0 in | Wt 355.0 lb

## 2017-04-11 DIAGNOSIS — Z0000 Encounter for general adult medical examination without abnormal findings: Secondary | ICD-10-CM | POA: Diagnosis not present

## 2017-04-11 DIAGNOSIS — R7301 Impaired fasting glucose: Secondary | ICD-10-CM | POA: Diagnosis not present

## 2017-04-11 DIAGNOSIS — J45909 Unspecified asthma, uncomplicated: Secondary | ICD-10-CM

## 2017-04-11 LAB — CBC WITH DIFFERENTIAL/PLATELET
Basophils Absolute: 0 cells/uL (ref 0–200)
Basophils Relative: 0 %
EOS ABS: 216 {cells}/uL (ref 15–500)
Eosinophils Relative: 2 %
HEMATOCRIT: 43.8 % (ref 38.5–50.0)
Hemoglobin: 14.5 g/dL (ref 13.2–17.1)
LYMPHS PCT: 32 %
Lymphs Abs: 3456 cells/uL (ref 850–3900)
MCH: 29.5 pg (ref 27.0–33.0)
MCHC: 33.1 g/dL (ref 32.0–36.0)
MCV: 89 fL (ref 80.0–100.0)
MONO ABS: 864 {cells}/uL (ref 200–950)
MONOS PCT: 8 %
MPV: 11.4 fL (ref 7.5–12.5)
NEUTROS PCT: 58 %
Neutro Abs: 6264 cells/uL (ref 1500–7800)
PLATELETS: 249 10*3/uL (ref 140–400)
RBC: 4.92 MIL/uL (ref 4.20–5.80)
RDW: 13.6 % (ref 11.0–15.0)
WBC: 10.8 10*3/uL — ABNORMAL HIGH (ref 4.0–10.5)

## 2017-04-11 LAB — COMPREHENSIVE METABOLIC PANEL
ALBUMIN: 4.2 g/dL (ref 3.6–5.1)
ALK PHOS: 61 U/L (ref 40–115)
ALT: 29 U/L (ref 9–46)
AST: 22 U/L (ref 10–40)
BILIRUBIN TOTAL: 0.6 mg/dL (ref 0.2–1.2)
BUN: 12 mg/dL (ref 7–25)
CHLORIDE: 107 mmol/L (ref 98–110)
CO2: 20 mmol/L (ref 20–31)
CREATININE: 1.04 mg/dL (ref 0.60–1.35)
Calcium: 9.4 mg/dL (ref 8.6–10.3)
Glucose, Bld: 94 mg/dL (ref 65–99)
Potassium: 4.3 mmol/L (ref 3.5–5.3)
Sodium: 138 mmol/L (ref 135–146)
TOTAL PROTEIN: 6.8 g/dL (ref 6.1–8.1)

## 2017-04-11 LAB — LIPID PANEL
CHOLESTEROL: 182 mg/dL (ref <200)
HDL: 43 mg/dL (ref >40)
LDL Cholesterol: 108 mg/dL — ABNORMAL HIGH (ref <100)
Total CHOL/HDL Ratio: 4.2 Ratio (ref <5.0)
Triglycerides: 156 mg/dL — ABNORMAL HIGH (ref <150)
VLDL: 31 mg/dL — ABNORMAL HIGH (ref <30)

## 2017-04-11 NOTE — Progress Notes (Signed)
Subjective:    Patient ID: Gregory Preston, male    DOB: 07/01/1976, 41 y.o.   MRN: [id number]  HPI He is here for complete examination. He does have underlying allergies and asthma but at the present time is on no medications for either of these. He does have a real inhaler at home but states he has not needed this in several months. He is in the process of making diet and exercise changes. He has a job change and is now walking over 20,000 steps per day. He notes that since this has occurred he has lost several inches in his waist. He has no other concerns or complaints. His home life is going quite well. His in with his wife for over 20 years. Family and social history as well as health maintenance and immunizations was reviewed. He does need a tetanus.   Review of Systems  All other systems reviewed and are negative.      Objective:   Physical Exam BP 120/80   Pulse 80   Ht 5\' 11"  (1.803 m)   Wt (!) 355 lb (161 kg)   SpO2 98%   BMI 49.51 kg/m   General Appearance:    Alert, cooperative, no distress, appears stated age  Head:    Normocephalic, without obvious abnormality, atraumatic  Eyes:    PERRL, conjunctiva/corneas clear, EOM's intact, fundi    benign  Ears:    Normal TM's and external ear canals  Nose:   Nares normal, mucosa normal, no drainage or sinus   tenderness  Throat:   Lips, mucosa, and tongue normal; teeth and gums normal  Neck:   Supple, no lymphadenopathy;  thyroid:  no   enlargement/tenderness/nodules; no carotid   bruit or JVD  Back:    Spine nontender, no curvature, ROM normal, no CVA     tenderness  Lungs:     Clear to auscultation bilaterally without wheezes, rales or     ronchi; respirations unlabored      Heart:    Regular rate and rhythm, S1 and S2 normal, no murmur, rub   or gallop     Abdomen:     Soft, non-tender, nondistended, normoactive bowel sounds,    no masses, no hepatosplenomegaly  Genitalia:    Normal male external genitalia without  lesions.  Testicles without masses.  No inguinal hernias.  Rectal:    Deferred. Guaiac cards given. .  Extremities:   No clubbing, cyanosis or edema  Pulses:   2+ and symmetric all extremities  Skin:   Skin color, texture, turgor normal, no rashes or lesions  Lymph nodes:   Cervical, supraclavicular, and axillary nodes normal  Neurologic:   CNII-XII intact, normal strength, sensation and gait; reflexes 2+ and symmetric throughout          Psych:   Normal mood, affect, hygiene and grooming.          Assessment & Plan:  Routine general medical examination at a health care facility - Plan: CBC with Differential/Platelet, Comprehensive metabolic panel, Lipid panel  Asthma in adult without complication, unspecified asthma severity, unspecified whether persistent  Impaired fasting blood sugar - Plan: Comprehensive metabolic panel  Morbid obesity (HCC) - Plan: CBC with Differential/Platelet, Comprehensive metabolic panel, Lipid panel He left before the TDaP could be given. He was called and told return here at his convenience to get the immunization update. Discussed the use of his Briel for his asthma and allergies but since he's not having any trouble,  no intervention needed. I encouraged him to continue with his diet and exercise. He plans to lose at least 100 pounds. Discussed setting a particular waist size and told him to not be in a hurry since he has been at this weight for quite some time and needs to lose at least 100 pounds.

## 2017-05-02 ENCOUNTER — Encounter (INDEPENDENT_AMBULATORY_CARE_PROVIDER_SITE_OTHER): Payer: Self-pay | Admitting: Family

## 2017-05-02 ENCOUNTER — Ambulatory Visit (INDEPENDENT_AMBULATORY_CARE_PROVIDER_SITE_OTHER): Payer: BLUE CROSS/BLUE SHIELD | Admitting: Family

## 2017-05-02 ENCOUNTER — Ambulatory Visit (INDEPENDENT_AMBULATORY_CARE_PROVIDER_SITE_OTHER): Payer: Self-pay

## 2017-05-02 VITALS — Ht 71.0 in | Wt 350.0 lb

## 2017-05-02 DIAGNOSIS — M5442 Lumbago with sciatica, left side: Secondary | ICD-10-CM

## 2017-05-02 MED ORDER — PREDNISONE 10 MG PO TABS: ORAL_TABLET | ORAL | 0 refills | Status: DC

## 2017-05-02 NOTE — Progress Notes (Signed)
Office Visit Note   Patient: Gregory Preston           Date of Birth: Oct 31, 1976           MRN: [id number] Visit Date: 05/02/2017              Requested by: Ronnald Nian, MD [address] [place], [place] [address] PCP: Ronnald Nian, MD  Chief Complaint  Patient presents with  . Lower Back - Pain      HPI: The patient is a 41 year old gentleman seen today for evaluation of low back pain with radicular symptoms down left leg. Down left buttock, posterior left thigh wraps around anterior shin. No numbness, tingling or weakness. States pain keeps him awake at night. Pain with sitting pain lying down. Relief with leaning forward.   Has been ongoing for 3 weeks. 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 Has been active doing yard work as well.   Went to urgent care for same, states had a shot of toradol and was given robaxin. Is taking ibuprofen tid. Minimal relief.   Assessment & Plan: Visit Diagnoses:  1. Acute left-sided low back pain with left-sided sciatica   2. Morbid obesity (HCC)     Plan: pred taper today. Follow up in 4 weeks. If no better will do mri l spine.   Follow-Up Instructions: No Follow-up on file.   Ortho Exam  Patient is alert, oriented, no adenopathy, well-dressed, normal affect, normal respiratory effort. Lumbar spine nontender. No focal motor weakness. Negative straight leg raise.   Imaging: Xr Lumbar Spine 2-3 Views  Result Date: 05/02/2017 Subtle loss of disc space L1-L2. No spondylolisthesis.   Labs: Lab Results  Component Value Date   HGBA1C 5.7 (H) 08/23/2012   HGBA1C 5.4 10/09/2011    Orders:  Orders Placed This Encounter  Procedures  . XR Lumbar Spine 2-3 Views   Meds ordered this encounter  Medications  . predniSONE (DELTASONE) 10 MG tablet    Sig: 6 tablets for 2 days, then 5 for 2 days, then 4 for 2 days, then 3  for 2 days, then 2 for 2 days, then 1 tablet for 2 days    Dispense:  42 tablet    Refill:  0     Procedures: No procedures performed  Clinical Data: No additional findings.  ROS:  All other systems negative, except as noted in the HPI. Review of Systems  Constitutional: Negative for chills and fever.  Musculoskeletal: Positive for back pain.    Objective: Vital Signs: Ht 5\' 11"  (1.803 m)   Wt (!) 350 lb (158.8 kg)   BMI 48.82 kg/m   Specialty Comments:  No specialty comments available.  PMFS History: Patient Active Problem List   Diagnosis Date Noted  . Asthma in adult 11/22/2011  . Morbid obesity (HCC) 10/09/2011  . Impaired fasting blood sugar 10/09/2011   Past Medical History:  Diagnosis Date  . Allergy   . Asthma   . Eczema   . Hand fracture, right 01/2012  . Peptic ulcer disease 11/11   s/p NSAID use, resolved  . Wears glasses     Family History  Problem Relation Age of Onset  . Peripheral vascular disease Father   . Heart disease Father        pacemaker  . COPD Father   . Heart disease Maternal Grandfather   . Hypertension Neg Hx   . Hyperlipidemia Neg Hx   . Stroke Neg Hx   .  Diabetes Neg Hx   . Cancer Neg Hx     Past Surgical History:  Procedure Laterality Date  . BONE CYST EXCISION  10/11   mandible cyst excised   Social History   Occupational History  . has a Radio broadcast assistant    Social History Main Topics  . Smoking status: Former Smoker    Packs/day: 0.25    Years: 10.00    Types: Cigars  . Smokeless tobacco: Never Used  . Alcohol use 1.0 oz/week    2 Standard drinks or equivalent per week     Comment: Rare  . Drug use: Yes    Types: Marijuana  . Sexual activity: Yes     Comment: married;  does landscaping; exercise through work, otherwise no

## 2017-09-26 ENCOUNTER — Other Ambulatory Visit: Payer: Self-pay | Admitting: Medical

## 2017-10-05 ENCOUNTER — Other Ambulatory Visit: Payer: Self-pay | Admitting: Medical

## 2017-10-05 ENCOUNTER — Telehealth: Payer: Self-pay | Admitting: Family Medicine

## 2017-10-05 DIAGNOSIS — J4530 Mild persistent asthma, uncomplicated: Secondary | ICD-10-CM

## 2017-10-05 MED ORDER — ALBUTEROL SULFATE HFA 108 (90 BASE) MCG/ACT IN AERS: INHALATION_SPRAY | RESPIRATORY_TRACT | 0 refills | Status: DC

## 2017-10-05 NOTE — Telephone Encounter (Signed)
Pt's wife, Hinton Dyer, called requesting a refill on ProAir to [employer] [facility]

## 2017-10-30 ENCOUNTER — Other Ambulatory Visit: Payer: Self-pay | Admitting: Medical

## 2017-11-01 ENCOUNTER — Telehealth: Payer: Self-pay | Admitting: Family Medicine

## 2017-11-01 ENCOUNTER — Other Ambulatory Visit: Payer: Self-pay | Admitting: Medical

## 2017-11-01 DIAGNOSIS — J4530 Mild persistent asthma, uncomplicated: Secondary | ICD-10-CM

## 2017-11-01 MED ORDER — ALBUTEROL SULFATE HFA 108 (90 BASE) MCG/ACT IN AERS: INHALATION_SPRAY | RESPIRATORY_TRACT | 0 refills | Status: DC

## 2017-11-01 NOTE — Telephone Encounter (Signed)
I sent inhaler.  The inhaler listed was for 8.5g, but the normal inhaler is 18g.  I sent the 18g.     However, if he is using the inhaler often, he should probably be back on Breo inhaler as well.  Set up recheck with Dr. Redmond School on asthma

## 2017-11-01 NOTE — Telephone Encounter (Signed)
Forwarding to [employer]

## 2017-11-01 NOTE — Telephone Encounter (Signed)
Called spoke with pt wife to notified her of this. Pt is going to call back an make an appt with dr.lalonde

## 2017-11-01 NOTE — Telephone Encounter (Signed)
Pt's wife called requesting a refill on [employer]. This refill was recently denied. Per Louretta Shorten, pt should still have enough of this med now ad if not he will need to be seen. Per wife, pt only has about 3 puffs left which will last until tomorrow. Per wife, pt has been using Dynegy more since he has been working outside. Wife will check with pt's schedule and call be today to schedule an appt for next week because his work schedule will not allow him to come in this week at all.   Can pt get a refill or some inhaler to help him for a few days?

## 2018-01-24 ENCOUNTER — Encounter: Payer: Self-pay | Admitting: Family Medicine

## 2018-01-24 ENCOUNTER — Institutional Professional Consult (permissible substitution): Payer: BLUE CROSS/BLUE SHIELD | Admitting: Family Medicine

## 2018-01-24 ENCOUNTER — Ambulatory Visit (INDEPENDENT_AMBULATORY_CARE_PROVIDER_SITE_OTHER): Payer: BLUE CROSS/BLUE SHIELD | Admitting: Family Medicine

## 2018-01-24 VITALS — BP 130/88 | HR 84 | Resp 16 | Wt 374.2 lb

## 2018-01-24 DIAGNOSIS — J3089 Other allergic rhinitis: Secondary | ICD-10-CM

## 2018-01-24 DIAGNOSIS — J4540 Moderate persistent asthma, uncomplicated: Secondary | ICD-10-CM | POA: Diagnosis not present

## 2018-01-24 MED ORDER — ALBUTEROL SULFATE HFA 108 (90 BASE) MCG/ACT IN AERS: INHALATION_SPRAY | RESPIRATORY_TRACT | 1 refills | Status: DC

## 2018-01-24 MED ORDER — FLUTICASONE FUROATE-VILANTEROL 200-25 MCG/INH IN AEPB: 1.0000 | INHALATION_SPRAY | Freq: Every day | RESPIRATORY_TRACT | 1 refills | Status: DC

## 2018-01-24 NOTE — Addendum Note (Signed)
Addended by: Minette Headland A on: 01/24/2018 11:13 AM   Modules accepted: Orders

## 2018-01-24 NOTE — Progress Notes (Signed)
Subjective:    Patient ID: Gregory Preston, male    DOB: 1975/12/09, 42 y.o.   MRN: [id number]  HPI Chief Complaint  Patient presents with  . discuss asthma med    discuss asthma med- he just needs a refill   He is here to follow up asthma. His PCP, Dr. Susann Givens, is out of the office today so he is seeing me for the first time. Reports frequent episodes of wheezing, coughing and shortness of breath and those symptoms resolve with albuterol. States he is using his albuterol inhaler 1-2 times per day. He is not currently using a maintenance medication, states Breo made him "jittery" so he stopped it but did not let his PCP know. States he only gave this one try. States he has used Q-var in the past but this was too expensive at that time.   Diagnosed with asthma as a child and triggers appear to be seasonal, weather changes, pet dander and dust. He does have a new dog for the past few months and states his asthma is worse while in his house. He has not tried any allergy medication.   Denies fever, chills, dizziness, chest pain, palpitations, shortness of breath, abdominal pain, N/V/D, urinary symptoms, LE edema.   Reviewed allergies, medications, past medical, surgical, family, and social history.   Review of Systems Pertinent positives and negatives in the history of present illness.     Objective:   Physical Exam BP 130/88   Pulse 84   Resp 16   Wt (!) 374 lb 3.2 oz (169.7 kg)   SpO2 96%   BMI 52.19 kg/m  Alert and in no distress. No sinus tenderness. Nares patent with erythema, no drainage. Tympanic membranes and canals are normal. Pharyngeal area is normal. Neck is supple without adenopathy or thyromegaly. Cardiac exam shows a regular sinus rhythm without murmurs or gallops. Lungs are clear to auscultation. Normal work of breathing.       Assessment & Plan:  Moderate persistent asthma in adult without complication - Plan: Spirometry with graph  Environmental and seasonal  allergies  Discussed that his asthma is uncontrolled and this may be due to allergies, especially dog dander. Will have him start back on Breo. He does not want to try a different medication. States Virgel Bouquet was covered under his insurance. He will also try a daily antihistamine. Zyrtec samples given.  Discussed that he should not need his albuterol more than once weekly during the day and not more than once monthly at nighttime.  PFT looks fine.  Follow up in 2-3 weeks with Dr. Susann Givens.

## 2018-01-24 NOTE — Patient Instructions (Signed)
Your asthma is not well controlled.  This may be due to allergies. Start taking a daily antihistamine such as Zyrtec, Claritin, Allegra, Xyzal.  Since you have Breo at home start using this once daily. The goal is for you to not need your albuterol inhaler more than once weekly during the day and once monthly at night.  Follow up with Dr. Redmond School in 2-3 weeks

## 2018-02-20 ENCOUNTER — Telehealth: Payer: Self-pay | Admitting: Family Medicine

## 2018-02-20 NOTE — Telephone Encounter (Addendum)
Pt's wife, Hinton Dyer, called to let Dr Redmond School know that [employer] is working really well. Pt is not having to use Pro Air much at all. Pt had to cancel his follow up appt that was scheduled for tomorrow and will reschedule later.

## 2018-02-21 ENCOUNTER — Ambulatory Visit: Payer: Self-pay | Admitting: Family Medicine

## 2018-04-17 ENCOUNTER — Other Ambulatory Visit: Payer: Self-pay | Admitting: Family Medicine

## 2018-04-17 NOTE — Telephone Encounter (Signed)
Ok to refill 

## 2018-04-17 NOTE — Telephone Encounter (Signed)
Is this okay?

## 2018-04-18 ENCOUNTER — Encounter: Payer: Self-pay | Admitting: Family Medicine

## 2018-04-18 ENCOUNTER — Ambulatory Visit (INDEPENDENT_AMBULATORY_CARE_PROVIDER_SITE_OTHER): Payer: BLUE CROSS/BLUE SHIELD | Admitting: Family Medicine

## 2018-04-18 VITALS — BP 124/76 | HR 85 | Temp 97.9°F | Ht 71.0 in | Wt 369.4 lb

## 2018-04-18 DIAGNOSIS — J4540 Moderate persistent asthma, uncomplicated: Secondary | ICD-10-CM | POA: Diagnosis not present

## 2018-04-18 DIAGNOSIS — R7301 Impaired fasting glucose: Secondary | ICD-10-CM | POA: Diagnosis not present

## 2018-04-18 DIAGNOSIS — R0683 Snoring: Secondary | ICD-10-CM | POA: Diagnosis not present

## 2018-04-18 DIAGNOSIS — J3089 Other allergic rhinitis: Secondary | ICD-10-CM | POA: Diagnosis not present

## 2018-04-18 DIAGNOSIS — Z0000 Encounter for general adult medical examination without abnormal findings: Secondary | ICD-10-CM

## 2018-04-18 NOTE — Patient Instructions (Signed)
20 minutes of something physical every day

## 2018-04-18 NOTE — Progress Notes (Signed)
Subjective:    Patient ID: Gregory Preston, male    DOB: 1976/10/15, 42 y.o.   MRN: [id number]  HPI He is here for complete examination.  He does have underlying asthma however since being placed on Breo, he has rarely used his albuterol rescue.  He is very happy with that.  He does have underlying seasonal allergies and presently they are not causing any difficulty.  He is also made some dietary changes mainly cutting back on dairy products as well as red meat and pork but has not seen much of an improvement in his weight.  He does have some difficulty with sleep stating he gets roughly 4 hours of sleep and then wakes up and is unable to fall back to sleep.  His wife does state that he snores.  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  Review of the record also indicates a history of glucose intolerance.  His marriage is going well.  Work is also going well for him.  Family and social history as well as health maintenance and immunizations was reviewed   Review of Systems  All other systems reviewed and are negative.      Objective:   Physical Exam BP 124/76 (BP Location: Left Arm, Patient Position: Sitting)   Pulse 85   Temp 97.9 F (36.6 C)   Ht 5\' 11"  (1.803 m)   Wt (!) 369 lb 6.4 oz (167.6 kg)   SpO2 98%   BMI 51.52 kg/m   General Appearance:    Alert, cooperative, no distress, appears stated age  Head:    Normocephalic, without obvious abnormality, atraumatic  Eyes:    PERRL, conjunctiva/corneas clear, EOM's intact, fundi    benign  Ears:    Normal TM's and external ear canals  Nose:   Nares normal, mucosa normal, no drainage or sinus   tenderness  Throat:   Lips, mucosa, and tongue normal; teeth and gums normal  Neck:   Supple, no lymphadenopathy;  thyroid:  no   enlargement/tenderness/nodules; no carotid   bruit or JVD     Lungs:     Clear to auscultation bilaterally without wheezes, rales or     ronchi; respirations unlabored       Heart:    Regular rate and rhythm, S1 and S2 normal, no murmur, rub   or gallop     Abdomen:     Soft, non-tender, nondistended, normoactive bowel sounds,    no masses, no hepatosplenomegaly  Genitalia:    Normal male external genitalia without lesions.  Testicles without masses.  No inguinal hernias.  Rectal:   Deferred  Extremities:   No clubbing, cyanosis or edema  Pulses:   2+ and symmetric all extremities  Skin:   Skin color, texture, turgor normal, no rashes or lesions  Lymph nodes:   Cervical, supraclavicular, and axillary nodes normal  Neurologic:   CNII-XII intact, normal strength, sensation and gait; reflexes 2+ and symmetric throughout          Psych:   Normal mood, affect, hygiene and grooming.           Assessment & Plan:  Routine general medical examination at a health care facility - Plan: CBC with Differential/Platelet, Comprehensive metabolic panel, Lipid panel  Moderate persistent asthma in adult without complication  Environmental and seasonal allergies  Morbid obesity (HCC) - Plan: Amb ref to Medical Nutrition Therapy-MNT, CBC with Differential/Platelet, Comprehensive metabolic panel, Lipid panel  Snoring - Plan: Home sleep test  Impaired fasting blood sugar  I encouraged him to add a 20-minute regimen daily of exercise and try and work this into his work schedule rather than after work. Discussed cutting back on carbohydrates but we will also send to nutritionist to make sure that we are covering all the bases there. He could easily have sleep apnea based on his size.  We will follow-up pending results of that.

## 2018-04-19 LAB — CBC WITH DIFFERENTIAL/PLATELET
BASOS: 0 %
Basophils Absolute: 0 10*3/uL (ref 0.0–0.2)
EOS (ABSOLUTE): 0.3 10*3/uL (ref 0.0–0.4)
Eos: 2 %
Hematocrit: 44.5 % (ref 37.5–51.0)
Hemoglobin: 14.7 g/dL (ref 13.0–17.7)
IMMATURE GRANS (ABS): 0 10*3/uL (ref 0.0–0.1)
IMMATURE GRANULOCYTES: 0 %
LYMPHS: 26 %
Lymphocytes Absolute: 3.2 10*3/uL — ABNORMAL HIGH (ref 0.7–3.1)
MCH: 29.9 pg (ref 26.6–33.0)
MCHC: 33 g/dL (ref 31.5–35.7)
MCV: 91 fL (ref 79–97)
MONOS ABS: 0.8 10*3/uL (ref 0.1–0.9)
Monocytes: 6 %
NEUTROS PCT: 66 %
Neutrophils Absolute: 7.7 10*3/uL — ABNORMAL HIGH (ref 1.4–7.0)
PLATELETS: 271 10*3/uL (ref 150–450)
RBC: 4.91 x10E6/uL (ref 4.14–5.80)
RDW: 13.7 % (ref 12.3–15.4)
WBC: 12 10*3/uL — AB (ref 3.4–10.8)

## 2018-04-19 LAB — COMPREHENSIVE METABOLIC PANEL
A/G RATIO: 1.9 (ref 1.2–2.2)
ALT: 35 IU/L (ref 0–44)
AST: 25 IU/L (ref 0–40)
Albumin: 4.6 g/dL (ref 3.5–5.5)
Alkaline Phosphatase: 74 IU/L (ref 39–117)
BUN/Creatinine Ratio: 10 (ref 9–20)
BUN: 11 mg/dL (ref 6–24)
Bilirubin Total: 0.3 mg/dL (ref 0.0–1.2)
CALCIUM: 9.9 mg/dL (ref 8.7–10.2)
CO2: 23 mmol/L (ref 20–29)
CREATININE: 1.09 mg/dL (ref 0.76–1.27)
Chloride: 102 mmol/L (ref 96–106)
GFR calc Af Amer: 96 mL/min/{1.73_m2} (ref >59)
GFR, EST NON AFRICAN AMERICAN: 83 mL/min/{1.73_m2} (ref >59)
GLUCOSE: 91 mg/dL (ref 65–99)
Globulin, Total: 2.4 g/dL (ref 1.5–4.5)
POTASSIUM: 4.9 mmol/L (ref 3.5–5.2)
Sodium: 141 mmol/L (ref 134–144)
TOTAL PROTEIN: 7 g/dL (ref 6.0–8.5)

## 2018-04-19 LAB — LIPID PANEL
CHOL/HDL RATIO: 4.2 ratio (ref 0.0–5.0)
Cholesterol, Total: 197 mg/dL (ref 100–199)
HDL: 47 mg/dL (ref >39)
LDL CALC: 115 mg/dL — AB (ref 0–99)
TRIGLYCERIDES: 173 mg/dL — AB (ref 0–149)
VLDL CHOLESTEROL CAL: 35 mg/dL (ref 5–40)

## 2018-05-20 DIAGNOSIS — G4733 Obstructive sleep apnea (adult) (pediatric): Secondary | ICD-10-CM

## 2018-05-20 HISTORY — DX: Obstructive sleep apnea (adult) (pediatric): G47.33

## 2018-05-21 ENCOUNTER — Ambulatory Visit (HOSPITAL_BASED_OUTPATIENT_CLINIC_OR_DEPARTMENT_OTHER): Payer: BLUE CROSS/BLUE SHIELD | Attending: Family Medicine | Admitting: Internal Medicine

## 2018-05-21 DIAGNOSIS — G4733 Obstructive sleep apnea (adult) (pediatric): Secondary | ICD-10-CM | POA: Diagnosis present

## 2018-06-01 DIAGNOSIS — G4733 Obstructive sleep apnea (adult) (pediatric): Secondary | ICD-10-CM

## 2018-06-01 NOTE — Procedures (Signed)
Patient Name: Gregory Preston, Gregory Preston Date: 05/22/2018 Gender: Male D.O.B: 05/16/1976 Age (years): 64 Referring Provider: Ronnald Nian Height (inches): 71 Interpreting Physician: Jetty Duhamel MD, ABSM Weight (lbs): 350 RPSGT: [place] BMI: 49 MRN: [id number] Neck Size: 19.50  CLINICAL INFORMATION Sleep Study Type: HST Indication for sleep study: Snoring  Epworth Sleepiness Score: 9  SLEEP STUDY TECHNIQUE A multi-channel overnight portable sleep study was performed. The channels recorded were: nasal airflow, thoracic respiratory movement, and oxygen saturation with a pulse oximetry. Snoring was also monitored.  MEDICATIONS Patient self administered medications include: none reported.  SLEEP ARCHITECTURE Patient was studied for 374.9 minutes. The sleep efficiency was 96.5 % and the patient was supine for 47.3%. The arousal index was 0.0 per hour.  RESPIRATORY PARAMETERS The overall AHI was 21.8 per hour, with a central apnea index of 0.0 per hour.  The oxygen nadir was 82% during sleep.  CARDIAC DATA Mean heart rate during sleep was 83.1 bpm.  IMPRESSIONS - Moderate obstructive sleep apnea occurred during this study (AHI = 21.8/h). - No significant central sleep apnea occurred during this study (CAI = 0.0/h). - Moderate oxygen desaturation was noted during this study (Min O2 = 82%). - Patient snored.  DIAGNOSIS - Obstructive Sleep Apnea (327.23 [G47.33 ICD-10])  RECOMMENDATIONS - Suggest CPAP titration sleep study or DME autopap. Other options would be based on clinical judgment. - Be careful with alcohol, sedatives and other CNS depressants that may worsen sleep apnea and disrupt normal sleep architecture. - Sleep hygiene should be reviewed to assess factors that may improve sleep quality. - Weight management and regular exercise should be initiated or continued.  [Electronically signed] 06/01/2018 08:25 AM  Jetty Duhamel MD, ABSM Diplomate,  American Board of Sleep Medicine   NPI: [id number]                         Jetty Duhamel Diplomate, American Board of Sleep Medicine  ELECTRONICALLY SIGNED ON:  06/01/2018, 8:24 AM [facility] PH: [phone]   FX: [phone] ACCREDITED BY THE AMERICAN ACADEMY OF SLEEP MEDICINE

## 2018-06-11 ENCOUNTER — Other Ambulatory Visit: Payer: Self-pay

## 2018-06-11 DIAGNOSIS — G4733 Obstructive sleep apnea (adult) (pediatric): Secondary | ICD-10-CM

## 2018-06-13 ENCOUNTER — Other Ambulatory Visit: Payer: Self-pay

## 2018-06-13 ENCOUNTER — Telehealth: Payer: Self-pay

## 2018-06-13 DIAGNOSIS — G4733 Obstructive sleep apnea (adult) (pediatric): Secondary | ICD-10-CM

## 2018-06-13 NOTE — Telephone Encounter (Signed)
Done KH 

## 2018-06-13 NOTE — Telephone Encounter (Signed)
Please sign off on pt cpap order. Thanks  [employer]

## 2018-06-13 NOTE — Telephone Encounter (Signed)
Order will be the folder.

## 2018-06-19 ENCOUNTER — Telehealth: Payer: Self-pay

## 2018-06-19 NOTE — Telephone Encounter (Signed)
Spoke to pt wife and advised her that [facility] is waiting on insurance to authorize pt for cpap machine

## 2018-08-07 ENCOUNTER — Other Ambulatory Visit: Payer: Self-pay | Admitting: Family Medicine

## 2018-09-17 ENCOUNTER — Other Ambulatory Visit: Payer: Self-pay | Admitting: Family Medicine

## 2018-09-17 NOTE — Telephone Encounter (Signed)
Your patient 

## 2018-09-17 NOTE — Telephone Encounter (Signed)
Is this ok to refill?  

## 2018-09-18 NOTE — Telephone Encounter (Signed)
Was this refilled for him?

## 2018-09-20 ENCOUNTER — Telehealth: Payer: Self-pay | Admitting: Family Medicine

## 2018-09-20 MED ORDER — FLUTICASONE FUROATE-VILANTEROL 200-25 MCG/INH IN AEPB: 1.0000 | INHALATION_SPRAY | Freq: Every day | RESPIRATORY_TRACT | 0 refills | Status: DC

## 2018-09-20 NOTE — Telephone Encounter (Signed)
Sent breo to [facility] for 30 days

## 2018-09-20 NOTE — Telephone Encounter (Signed)
   Needs Breo rx sent to new pharmacy  [facility]  She wants rx for 30 day supply ( not 90 day due to $$)

## 2019-01-17 ENCOUNTER — Telehealth: Payer: Self-pay | Admitting: Family Medicine

## 2019-01-17 NOTE — Telephone Encounter (Signed)
Pt needs refill on Albuterol. [facility]

## 2019-01-18 MED ORDER — ALBUTEROL SULFATE HFA 108 (90 BASE) MCG/ACT IN AERS: INHALATION_SPRAY | RESPIRATORY_TRACT | 1 refills | Status: DC

## 2019-01-20 ENCOUNTER — Other Ambulatory Visit: Payer: Self-pay | Admitting: Family Medicine

## 2019-02-16 ENCOUNTER — Other Ambulatory Visit: Payer: Self-pay | Admitting: Family Medicine

## 2019-02-17 NOTE — Telephone Encounter (Signed)
[facility] is requesting to fill pt albuterol. Please advise H

## 2019-03-14 ENCOUNTER — Other Ambulatory Visit: Payer: Self-pay | Admitting: Family Medicine

## 2019-03-14 NOTE — Telephone Encounter (Signed)
Is this okay to refill? 

## 2019-03-18 ENCOUNTER — Telehealth: Payer: Self-pay | Admitting: Family Medicine

## 2019-03-18 MED ORDER — FLUTICASONE FUROATE-VILANTEROL 200-25 MCG/INH IN AEPB: 1.0000 | INHALATION_SPRAY | Freq: Every day | RESPIRATORY_TRACT | 1 refills | Status: DC

## 2019-03-18 NOTE — Telephone Encounter (Signed)
[facility] is requesting a 90 day supply for breo inhaler for insurance please [id number] [place]

## 2019-11-04 ENCOUNTER — Other Ambulatory Visit: Payer: Self-pay | Admitting: Family Medicine

## 2019-11-04 NOTE — Telephone Encounter (Signed)
Set him up for a visit.  It has been over a year.

## 2019-11-04 NOTE — Telephone Encounter (Signed)
Pt. Wife called stating he needs a refill on his Breo inhaler to [facility] [address] pt. Last apt 04/18/2018

## 2019-11-05 NOTE — Telephone Encounter (Signed)
I called pt. LM to call to get scheduled.

## 2019-12-23 ENCOUNTER — Ambulatory Visit: Payer: BLUE CROSS/BLUE SHIELD | Admitting: Family

## 2020-01-27 ENCOUNTER — Other Ambulatory Visit: Payer: Self-pay | Admitting: Family Medicine

## 2020-01-27 ENCOUNTER — Telehealth: Payer: Self-pay | Admitting: Family Medicine

## 2020-01-27 MED ORDER — BREO ELLIPTA 200-25 MCG/INH IN AEPB: 1.0000 | INHALATION_SPRAY | Freq: Every day | RESPIRATORY_TRACT | 3 refills | Status: DC

## 2020-01-27 MED ORDER — ALBUTEROL SULFATE HFA 108 (90 BASE) MCG/ACT IN AERS: INHALATION_SPRAY | RESPIRATORY_TRACT | 0 refills | Status: DC

## 2020-01-27 NOTE — Telephone Encounter (Signed)
Patient wife was informed he needs to get the covid vaccine as soon as possible.

## 2020-01-27 NOTE — Telephone Encounter (Signed)
Pt called and is requesting a refill on his BREO and albuterol please send to the  [id number] [place] Pt wife also wants to know If it ok for pt to get the COVID shot please advise pt can be reached at [phone]

## 2020-01-27 NOTE — Telephone Encounter (Signed)
Get the shot as soon as possible

## 2020-02-03 ENCOUNTER — Other Ambulatory Visit: Payer: Self-pay | Admitting: Family Medicine

## 2020-02-03 NOTE — Telephone Encounter (Signed)
[facility]S is requesting to fill pt breo. Please advise . [place]

## 2020-10-21 ENCOUNTER — Telehealth: Payer: Self-pay

## 2020-10-21 NOTE — Telephone Encounter (Signed)
Done KH 

## 2020-10-21 NOTE — Telephone Encounter (Signed)
Error

## 2020-10-21 NOTE — Telephone Encounter (Signed)
Pt. 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 If someone here could order that for him or let her know what he needs to do to get that ordered.

## 2020-10-22 ENCOUNTER — Ambulatory Visit: Payer: BC Managed Care – PPO

## 2020-11-19 ENCOUNTER — Other Ambulatory Visit: Payer: Self-pay | Admitting: Family Medicine

## 2020-11-22 NOTE — Telephone Encounter (Signed)
Is this okay to refill? 

## 2021-04-15 ENCOUNTER — Other Ambulatory Visit: Payer: Self-pay | Admitting: Family Medicine

## 2021-04-19 NOTE — Telephone Encounter (Signed)
Needs an appt. Don't let him runout

## 2021-04-19 NOTE — Telephone Encounter (Signed)
[facility] to [facility]. Pt was mailed a letter to advise he needs an appt. Please advise if this can be filled. [place]

## 2021-05-09 ENCOUNTER — Other Ambulatory Visit: Payer: Self-pay | Admitting: Family Medicine

## 2021-05-09 NOTE — Telephone Encounter (Signed)
[facility]ol. Please advise [facility]

## 2021-09-04 ENCOUNTER — Other Ambulatory Visit: Payer: Self-pay | Admitting: Family Medicine

## 2021-10-21 ENCOUNTER — Ambulatory Visit (INDEPENDENT_AMBULATORY_CARE_PROVIDER_SITE_OTHER): Payer: BC Managed Care – PPO | Admitting: Family Medicine

## 2021-10-21 ENCOUNTER — Other Ambulatory Visit: Payer: Self-pay

## 2021-10-21 ENCOUNTER — Encounter: Payer: Self-pay | Admitting: Family Medicine

## 2021-10-21 ENCOUNTER — Encounter: Payer: BLUE CROSS/BLUE SHIELD | Admitting: Family Medicine

## 2021-10-21 VITALS — BP 138/88 | HR 90 | Temp 98.9°F | Ht 69.5 in | Wt 371.6 lb

## 2021-10-21 DIAGNOSIS — Z0000 Encounter for general adult medical examination without abnormal findings: Secondary | ICD-10-CM | POA: Diagnosis not present

## 2021-10-21 DIAGNOSIS — R7301 Impaired fasting glucose: Secondary | ICD-10-CM | POA: Diagnosis not present

## 2021-10-21 DIAGNOSIS — Z1211 Encounter for screening for malignant neoplasm of colon: Secondary | ICD-10-CM

## 2021-10-21 DIAGNOSIS — J4540 Moderate persistent asthma, uncomplicated: Secondary | ICD-10-CM

## 2021-10-21 DIAGNOSIS — Z1159 Encounter for screening for other viral diseases: Secondary | ICD-10-CM

## 2021-10-21 DIAGNOSIS — Z23 Encounter for immunization: Secondary | ICD-10-CM | POA: Diagnosis not present

## 2021-10-21 DIAGNOSIS — G4733 Obstructive sleep apnea (adult) (pediatric): Secondary | ICD-10-CM | POA: Diagnosis not present

## 2021-10-21 NOTE — Progress Notes (Signed)
Subjective:    Patient ID: Gregory Preston, male    DOB: 1976/08/09, 45 y.o.   MRN: [id number]  HPI He is here for complete examination.  He does have underlying asthma and is on Breo.  He also uses his rescue inhaler usually once per week.  He states that he has lost from well over 400 pounds to his present weight and has made further adjustments.  Review of the record indicates he has OSA but he states that he has never been informed of that.  His marriage and home life are going well.  He has no other concerns or complaints.  Family and social history as well as health maintenance and immunizations was reviewed.   Review of Systems  All other systems reviewed and are negative.     Objective:   Physical Exam Alert and in no distress. Tympanic membranes and canals are normal. Pharyngeal area is normal. Neck is supple without adenopathy or thyromegaly. Cardiac exam shows a regular sinus rhythm without murmurs or gallops. Lungs are clear to auscultation.        Assessment & Plan:  Routine general medical examination at a health care facility - Plan: CBC with Differential/Platelet, Comprehensive metabolic panel, Lipid panel  Need for pneumococcal vaccination - Plan: Pneumococcal polysaccharide vaccine 23-valent greater than or equal to 2yo subcutaneous/IM  Need for tetanus booster - Plan: Tdap vaccine greater than or equal to 7yo IM  Morbid obesity (HCC)  Impaired fasting blood sugar  Moderate persistent asthma in adult without complication  OSA (obstructive sleep apnea) - Plan: For home use only DME continuous positive airway pressure (CPAP)  Need for influenza vaccination - Plan: Flu Vaccine QUAD 6+ mos PF IM (Fluarix Quad PF)  Screening for colon cancer - Plan: Cologuard  Need for hepatitis C screening test - Plan: Hepatitis C antibody Immunizations were updated. Discussed the use of Breo and his inhaler. Discussed continued weight loss in terms of cutting back on  carbohydrates.  He keeps himself fairly busy with his work. Discussed use of the CPAP in terms of getting this under control and actually showing the benefit of energy and stamina etc.

## 2021-10-22 LAB — COMPREHENSIVE METABOLIC PANEL
ALT: 27 IU/L (ref 0–44)
AST: 23 IU/L (ref 0–40)
Albumin/Globulin Ratio: 1.6 (ref 1.2–2.2)
Albumin: 4.6 g/dL (ref 4.0–5.0)
Alkaline Phosphatase: 80 IU/L (ref 44–121)
BUN/Creatinine Ratio: 10 (ref 9–20)
BUN: 11 mg/dL (ref 6–24)
Bilirubin Total: 0.4 mg/dL (ref 0.0–1.2)
CO2: 21 mmol/L (ref 20–29)
Calcium: 10 mg/dL (ref 8.7–10.2)
Chloride: 102 mmol/L (ref 96–106)
Creatinine, Ser: 1.1 mg/dL (ref 0.76–1.27)
Globulin, Total: 2.8 g/dL (ref 1.5–4.5)
Glucose: 89 mg/dL (ref 70–99)
Potassium: 4.2 mmol/L (ref 3.5–5.2)
Sodium: 140 mmol/L (ref 134–144)
Total Protein: 7.4 g/dL (ref 6.0–8.5)
eGFR: 83 mL/min/{1.73_m2} (ref >59)

## 2021-10-22 LAB — CBC WITH DIFFERENTIAL/PLATELET
Basophils Absolute: 0 10*3/uL (ref 0.0–0.2)
Basos: 0 %
EOS (ABSOLUTE): 0.3 10*3/uL (ref 0.0–0.4)
Eos: 2 %
Hematocrit: 44.2 % (ref 37.5–51.0)
Hemoglobin: 14.8 g/dL (ref 13.0–17.7)
Immature Grans (Abs): 0.1 10*3/uL (ref 0.0–0.1)
Immature Granulocytes: 1 %
Lymphocytes Absolute: 3.3 10*3/uL — ABNORMAL HIGH (ref 0.7–3.1)
Lymphs: 22 %
MCH: 29.7 pg (ref 26.6–33.0)
MCHC: 33.5 g/dL (ref 31.5–35.7)
MCV: 89 fL (ref 79–97)
Monocytes Absolute: 1 10*3/uL — ABNORMAL HIGH (ref 0.1–0.9)
Monocytes: 7 %
Neutrophils Absolute: 10 10*3/uL — ABNORMAL HIGH (ref 1.4–7.0)
Neutrophils: 68 %
Platelets: 241 10*3/uL (ref 150–450)
RBC: 4.98 x10E6/uL (ref 4.14–5.80)
RDW: 12.8 % (ref 11.6–15.4)
WBC: 14.6 10*3/uL — ABNORMAL HIGH (ref 3.4–10.8)

## 2021-10-22 LAB — LIPID PANEL
Chol/HDL Ratio: 4.7 ratio (ref 0.0–5.0)
Cholesterol, Total: 228 mg/dL — ABNORMAL HIGH (ref 100–199)
HDL: 49 mg/dL (ref >39)
LDL Chol Calc (NIH): 158 mg/dL — ABNORMAL HIGH (ref 0–99)
Triglycerides: 117 mg/dL (ref 0–149)
VLDL Cholesterol Cal: 21 mg/dL (ref 5–40)

## 2021-10-22 LAB — HEPATITIS C ANTIBODY: Hep C Virus Ab: <0.1 s/co ratio (ref 0.0–0.9)

## 2021-11-03 ENCOUNTER — Other Ambulatory Visit: Payer: Self-pay

## 2021-11-03 ENCOUNTER — Other Ambulatory Visit: Payer: Self-pay | Admitting: Family Medicine

## 2021-11-03 ENCOUNTER — Telehealth (INDEPENDENT_AMBULATORY_CARE_PROVIDER_SITE_OTHER): Payer: BC Managed Care – PPO | Admitting: Family Medicine

## 2021-11-03 DIAGNOSIS — J4540 Moderate persistent asthma, uncomplicated: Secondary | ICD-10-CM

## 2021-11-03 DIAGNOSIS — J069 Acute upper respiratory infection, unspecified: Secondary | ICD-10-CM | POA: Diagnosis not present

## 2021-11-03 NOTE — Progress Notes (Signed)
Subjective:    Patient ID: Gregory Preston, male    DOB: 1976/09/15, 45 y.o.   MRN: [id number]  HPI Documentation for virtual audio and video telecommunications through Caregility encounter: The patient was located at home. 2 patient identifiers used.  The provider was located in the office. The patient did consent to this visit and is aware of possible charges through their insurance for this visit. The other persons participating in this telemedicine service were none. Time spent on call was 5 minutes and in review of previous records >17 minutes total for counseling and coordination of care. This virtual service is not related to other E/M service within previous 7 days.  He states that he woke up Tuesday with some chest congestion and coughing as well as nasal congestion.  He used NyQuil for it that night.  Wednesday he had more chest congestion and tightness.  He did COVID test which was negative to date.  Today he is feeling better.  Review of Systems     Objective:   Physical Exam  Alert and in no distress otherwise not examined      Assessment & Plan:  Moderate persistent asthma in adult without complication  Viral URI with cough He is to continue on his allergy medicine and use the rescue inhaler as needed.  Recommend Tylenol for the chest discomfort when he coughs as well as using Delsym.  He can use NyQuil at night.  He was comfortable with that.

## 2021-11-03 NOTE — Telephone Encounter (Signed)
[facility]. Please advise [facility]

## 2021-11-09 ENCOUNTER — Ambulatory Visit (INDEPENDENT_AMBULATORY_CARE_PROVIDER_SITE_OTHER): Payer: BC Managed Care – PPO | Admitting: Medical

## 2021-11-09 ENCOUNTER — Other Ambulatory Visit: Payer: Self-pay

## 2021-11-09 ENCOUNTER — Encounter: Payer: Self-pay | Admitting: Medical

## 2021-11-09 VITALS — Ht 71.0 in | Wt 378.4 lb

## 2021-11-09 DIAGNOSIS — H6980 Other specified disorders of Eustachian tube, unspecified ear: Secondary | ICD-10-CM | POA: Diagnosis not present

## 2021-11-09 DIAGNOSIS — Z6841 Body Mass Index (BMI) 40.0 and over, adult: Secondary | ICD-10-CM

## 2021-11-09 DIAGNOSIS — H6990 Unspecified Eustachian tube disorder, unspecified ear: Secondary | ICD-10-CM

## 2021-11-09 DIAGNOSIS — R42 Dizziness and giddiness: Secondary | ICD-10-CM

## 2021-11-09 DIAGNOSIS — R0789 Other chest pain: Secondary | ICD-10-CM

## 2021-11-09 DIAGNOSIS — D72829 Elevated white blood cell count, unspecified: Secondary | ICD-10-CM

## 2021-11-09 MED ORDER — MECLIZINE HCL 25 MG PO TABS: 25.0000 mg | ORAL_TABLET | Freq: Two times a day (BID) | ORAL | 0 refills | Status: DC

## 2021-11-09 MED ORDER — FLUTICASONE PROPIONATE 50 MCG/ACT NA SUSP: 2.0000 | Freq: Every day | NASAL | 0 refills | Status: DC

## 2021-11-09 NOTE — Progress Notes (Signed)
Subjective:  Gregory Preston is a 45 y.o. male who presents for Chief Complaint  Patient presents with   Dizziness    Every morning since Monday with being lightheaded, was worst on Monday. Feels Fatigued, dizziness is in the morning only. Covid Negative last Wednesday     Here with wife for c/o lightheaded/dizziness.  Here with wife today.  Started 3 days ago with the lightheadedness.  However he had a respiratory tract infection, and cold symptoms last week that lasted for about 5 days and then seem to get better.  3 days ago awoke with a really bad lightheadedness and dizziness.  That was the worst day but then had it again the last 2 days.  Mainly the symptoms been in the morning but as the day went on he would get better the symptoms would last for couple hours.  No nausea, no vomiting, no diarrhea, no shortness of breath, no numbness or tingling or weakness, no urinary changes, no bowel changes, no speech or vision change no confusion.  He has asthma on Breo daily but no changes in breathing.  No change in thirst.  No swelling.  He does have some popping in his ears.  He also gets some ringing in the ears.  No hearing loss  Has history of sleep apnea but still waiting on CPAP as they have been on national backorder  Although the symptoms have improved he did have 1 episode of chest discomfort this morning without any associated nausea, sweating, shortness of breath or palpitations.  It lasted a short period and went away.  No other recent chest pains.  He notes that he drinks a good amount of water in general  No other aggravating or relieving factors.    No other c/o.  Past Medical History:  Diagnosis Date   Allergy    Asthma    Eczema    Hand fracture, right 01/2012   Peptic ulcer disease 11/11   s/p NSAID use, resolved   Wears glasses    Current Outpatient Medications on File Prior to Visit  Medication Sig Dispense Refill   albuterol (VENTOLIN HFA) 108 (90 Base) MCG/ACT  inhaler INHALE 2 PUFFS EVERY 6 HOURS AS NEEDED FOR WHEEZING/SHORTNESS OF BREATH 8.5 each 0   BREO ELLIPTA 200-25 MCG/ACT AEPB INHALE 1 PUFF BY MOUTH EVERY DAY 180 each 0   Pseudoeph-Doxylamine-DM-APAP (DAYQUIL/NYQUIL COLD/FLU RELIEF PO) Take by mouth. (Patient not taking: Reported on 11/09/2021)     No current facility-administered medications on file prior to visit.   Family History  Problem Relation Age of Onset   Peripheral vascular disease Father    Heart disease Father        pacemaker   COPD Father    Heart disease Maternal Grandfather    Hypertension Neg Hx    Hyperlipidemia Neg Hx    Stroke Neg Hx    Diabetes Neg Hx    Cancer Neg Hx      The following portions of the patient's history were reviewed and updated as appropriate: allergies, current medications, past family history, past medical history, past social history, past surgical history and problem list.  ROS Otherwise as in subjective above  Objective: Ht 5' 11"  (1.803 m)    Wt (!) 378 lb 6.4 oz (171.6 kg)    SpO2 97%    BMI 52.78 kg/m   Wt Readings from Last 3 Encounters:  11/09/21 (!) 378 lb 6.4 oz (171.6 kg)  10/21/21 (!) 371 lb 9.6 oz (  168.6 kg)  04/18/18 (!) 369 lb 6.4 oz (167.6 kg)   BP Readings from Last 3 Encounters:  10/21/21 138/88  04/18/18 124/76  01/24/18 130/88    General appearance: alert, no distress, well developed, well nourished, morbidly obese African-American male HEENT: normocephalic, sclerae anicteric, conjunctiva pink and moist, TMs with serous effusion bilaterally,, nares patent, no discharge or erythema, pharynx normal Oral cavity: MMM, no lesions Neck: supple, no lymphadenopathy, no thyromegaly, no masses, no JVD or bruits Heart: RRR, normal S1, S2, no murmurs Lungs: CTA bilaterally, no wheezes, rhonchi, or rales Pulses: 2+ radial pulses, 2+ pedal pulses, normal cap refill Ext: no edema Neuro: CN2-12 intact, nonfocal exam, normal romberg   EKG Indication chest discomfort,  rate 81 bpm, PR 153m, QRS 848m QTC 443, axis 22 degrees, nsr, no acute changes     Assessment: Encounter Diagnoses  Name Primary?   Lightheaded Yes   Chest discomfort    BMI 50.0-59.9, adult (HCC)    Dizziness    Dysfunction of Eustachian tube, unspecified laterality    Leukocytosis, unspecified type      Plan: We discussed his EKG findings which were normal.  His symptoms are suggestive of post viral labyrinthitis.  Begin medications below Flonase and meclizine.  Caution on sedation.  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  Advise rest, hydration.  If not much improved within the next 48 hours then call back  Advise he return in a few weeks once he is symptom-free for hearing test given some history of ringing in the ears  Chest discomfort is nonspecific and resolved.  Advised if any significant chest pain or cardiac symptoms such as shortness of breath, chest pain, sweating, nausea then call 911.  Discussed symptoms that would be worrisome for this.  Unrelated likely is leukocytosis.  Looking back through her labs he has had mild leukocytosis the last few years but it has gradually gotten a little worse.  We discussed possible causes for this.  Advised the return and follow-up with PCP about this   [name] seen today for dizziness.  Diagnoses and all orders for this visit:  Lightheaded -     EKG 12-Lead  Chest discomfort -     EKG 12-Lead  BMI 50.0-59.9, adult (HCC)  Dizziness  Dysfunction of Eustachian tube, unspecified laterality  Leukocytosis, unspecified type  Other orders -     meclizine (ANTIVERT) 25 MG tablet; Take 1 tablet (25 mg total) by mouth 2 (two) times daily. -     fluticasone (FLONASE) 50 MCG/ACT nasal spray; Place 2 sprays into both nostrils daily.    Follow up: Soon with Dr. LaRedmond Schoolor recheck

## 2021-12-04 LAB — COLOGUARD: COLOGUARD: NEGATIVE

## 2021-12-06 ENCOUNTER — Other Ambulatory Visit: Payer: Self-pay | Admitting: Medical

## 2022-01-19 ENCOUNTER — Ambulatory Visit (INDEPENDENT_AMBULATORY_CARE_PROVIDER_SITE_OTHER): Payer: BC Managed Care – PPO | Admitting: Physician Assistant

## 2022-01-19 ENCOUNTER — Encounter: Payer: Self-pay | Admitting: Physician Assistant

## 2022-01-19 VITALS — BP 138/86 | HR 72 | Temp 99.1°F | Ht 71.0 in | Wt 372.8 lb

## 2022-01-19 DIAGNOSIS — L03314 Cellulitis of groin: Secondary | ICD-10-CM | POA: Diagnosis not present

## 2022-01-19 DIAGNOSIS — D72829 Elevated white blood cell count, unspecified: Secondary | ICD-10-CM | POA: Diagnosis not present

## 2022-01-19 DIAGNOSIS — R7301 Impaired fasting glucose: Secondary | ICD-10-CM | POA: Diagnosis not present

## 2022-01-19 MED ORDER — SULFAMETHOXAZOLE-TRIMETHOPRIM 800-160 MG PO TABS: 1.0000 | ORAL_TABLET | Freq: Two times a day (BID) | ORAL | 0 refills | Status: AC

## 2022-01-19 NOTE — Progress Notes (Signed)
? ?Acute Office Visit ? ?Subjective:  ? ? Patient ID: Gregory Preston, male    DOB: 10/23/1976, 46 y.o.   MRN: [id number] ? ?Chief Complaint  ?Patient presents with  ? Cyst  ?  Has a boil on his right testicle that starting draining Monday-he is wondering if he needs an abx. Area is swollen and he is beginning to sweat.   ? ? ?HPI ?Patient is in today for a follow up appointment. ? 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 ? ? ?Past Medical History:  ?Diagnosis Date  ? Allergy   ? Asthma   ? Eczema   ? Hand fracture, right 01/2012  ? Peptic ulcer disease 11/11  ? s/p NSAID use, resolved  ? Wears glasses   ? ? ?Past Surgical History:  ?Procedure Laterality Date  ? BONE CYST EXCISION  10/11  ? mandible cyst excised  ? ? ?Family History  ?Problem Relation Age of Onset  ? Peripheral vascular disease Father   ? Heart disease Father   ?     pacemaker  ? COPD Father   ? Heart disease Maternal Grandfather   ? Hypertension Neg Hx   ? Hyperlipidemia Neg Hx   ? Stroke Neg Hx   ? Diabetes Neg Hx   ? Cancer Neg Hx   ? ? ?Social History  ? ?Socioeconomic History  ? Marital status: Married  ?  Spouse name: Not on file  ? Number of children: Not on file  ? Years of education: Not on file  ? Highest education level: Not on file  ?Occupational History  ? Occupation: has a Aeronautical engineer business  ?Tobacco Use  ? Smoking status: Former  ?  Packs/day: 0.25  ?  Years: 10.00  ?  Pack years: 2.50  ?  Types: Cigars, Cigarettes  ? Smokeless tobacco: Never  ?Substance and Sexual Activity  ? Alcohol use: Yes  ?  Alcohol/week: 2.0 standard drinks  ?  Types: 2 Standard drinks or equivalent per week  ?  Comment: Rare  ? Drug use: Yes  ?  Types: Marijuana  ? Sexual  activity: Yes  ?  Comment: married;  does landscaping; exercise through work, otherwise no  ?Other Topics Concern  ? Not on file  ?Social History Narrative  ? Married, exercise - landscaping at work; no children  ? ?Social Determinants of Health  ? ?Financial Resource Strain: Not on file  ?Food Insecurity: Not on file  ?Transportation Needs: Not on file  ?Physical Activity: Not on file  ?Stress: Not on file  ?Social Connections: Not on file  ?Intimate Partner Violence: Not on file  ? ? ?Outpatient Medications Prior to Visit  ?Medication Sig Dispense Refill  ? BREO ELLIPTA 200-25 MCG/ACT AEPB INHALE 1 PUFF BY MOUTH EVERY DAY 180 each 0  ? ibuprofen (ADVIL) 200 MG tablet Take 400 mg by mouth every 6 (six) hours as needed.    ? Ibuprofen-diphenhydrAMINE Cit (ADVIL PM PO) Take 400 mg by mouth as needed.    ? albuterol (VENTOLIN HFA) 108 (90 Base) MCG/ACT inhaler INHALE 2 PUFFS EVERY 6 HOURS AS NEEDED FOR WHEEZING/SHORTNESS OF BREATH (Patient not taking: Reported on  01/19/2022) 8.5 each 0  ? fluticasone (FLONASE) 50 MCG/ACT nasal spray SPRAY 2 SPRAYS INTO EACH NOSTRIL EVERY DAY 16 mL 0  ? meclizine (ANTIVERT) 25 MG tablet Take 1 tablet (25 mg total) by mouth 2 (two) times daily. 30 tablet 0  ? Pseudoeph-Doxylamine-DM-APAP (DAYQUIL/NYQUIL COLD/FLU RELIEF PO) Take by mouth. (Patient not taking: Reported on 11/09/2021)    ? ?No facility-administered medications prior to visit.  ? ? ?No Known Allergies ? ?Review of Systems  ?Constitutional:  Negative for activity change, chills, fatigue and fever.  ?HENT:  Negative for congestion, ear pain, hearing loss and voice change.   ?Eyes:  Negative for pain and redness.  ?Respiratory:  Negative for cough and shortness of breath.   ?Cardiovascular:  Negative for leg swelling.  ?Gastrointestinal:  Negative for constipation, diarrhea, nausea and vomiting.  ?Endocrine: Negative for polyuria.  ?Genitourinary:  Positive for scrotal swelling and testicular pain. Negative for flank pain,  frequency, penile discharge, penile pain and penile swelling.  ?Musculoskeletal:  Negative for joint swelling and neck pain.  ?Skin:  Negative for rash.  ?Neurological:  Negative for dizziness.  ?Hematological:  Does not bruise/bleed easily.  ?Psychiatric/Behavioral:  Negative for agitation and behavioral problems.   ? ?   ?Objective:  ?  ?Physical Exam ?Vitals and nursing note reviewed.  ?Constitutional:   ?   General: He is not in acute distress. ?   Appearance: Normal appearance.  ?HENT:  ?   Head: Normocephalic and atraumatic.  ?   Right Ear: External ear normal.  ?   Left Ear: External ear normal.  ?   Nose: No congestion.  ?Eyes:  ?   Extraocular Movements: Extraocular movements intact.  ?   Conjunctiva/sclera: Conjunctivae normal.  ?   Pupils: Pupils are equal, round, and reactive to light.  ?Cardiovascular:  ?   Rate and Rhythm: Normal rate and regular rhythm.  ?   Pulses: Normal pulses.  ?   Heart sounds: Normal heart sounds.  ?Pulmonary:  ?   Effort: Pulmonary effort is normal.  ?   Breath sounds: Normal breath sounds. No wheezing.  ?Abdominal:  ?   General: Bowel sounds are normal.  ?   Palpations: Abdomen is soft.  ?Musculoskeletal:     ?   General: Normal range of motion.  ?   Cervical back: Normal range of motion and neck supple.  ?   Right lower leg: No edema.  ?   Left lower leg: No edema.  ?Skin: ?   General: Skin is warm and dry.  ?   Findings: No rash.  ?Neurological:  ?   Mental Status: He is alert and oriented to person, place, and time.  ?   Gait: Gait normal.  ?Psychiatric:     ?   Mood and Affect: Mood normal.     ?   Behavior: Behavior normal.  ? ? ?BP 138/86   Pulse 72   Temp 99.1 ?F (37.3 ?C) (Tympanic)   Ht 5\' 11"  (1.803 m)   Wt (!) 372 lb 12.8 oz (169.1 kg)   BMI 52.00 kg/m?  ? ?Wt Readings from Last 3 Encounters:  ?01/19/22 (!) 372 lb 12.8 oz (169.1 kg)  ?11/09/21 (!) 378 lb 6.4 oz (171.6 kg)  ?10/21/21 (!) 371 lb 9.6 oz (168.6 kg)  ? ? ?There are no preventive care reminders to  display for this patient. ? ?There are no preventive care reminders to display for this patient. ? ? ?Lab Results  ?  Component Value Date  ? TSH 0.645 05/08/2011  ? ?Lab Results  ?Component Value Date  ? WBC 14.6 (H) 10/21/2021  ? HGB 14.8 10/21/2021  ? HCT 44.2 10/21/2021  ? MCV 89 10/21/2021  ? PLT 241 10/21/2021  ? ?Lab Results  ?Component Value Date  ? NA 140 10/21/2021  ? K 4.2 10/21/2021  ? CO2 21 10/21/2021  ? GLUCOSE 89 10/21/2021  ? BUN 11 10/21/2021  ? CREATININE 1.10 10/21/2021  ? BILITOT 0.4 10/21/2021  ? ALKPHOS 80 10/21/2021  ? AST 23 10/21/2021  ? ALT 27 10/21/2021  ? PROT 7.4 10/21/2021  ? ALBUMIN 4.6 10/21/2021  ? CALCIUM 10.0 10/21/2021  ? EGFR 83 10/21/2021  ? ?Lab Results  ?Component Value Date  ? CHOL 228 (H) 10/21/2021  ? ?Lab Results  ?Component Value Date  ? HDL 49 10/21/2021  ? ?Lab Results  ?Component Value Date  ? LDLCALC 158 (H) 10/21/2021  ? ?Lab Results  ?Component Value Date  ? TRIG 117 10/21/2021  ? ?Lab Results  ?Component Value Date  ? CHOLHDL 4.7 10/21/2021  ? ?Lab Results  ?Component Value Date  ? HGBA1C 5.7 (H) 08/23/2012  ? ? ?   ?Assessment & Plan:  ? ?Problem List Items Addressed This Visit   ?None ? ? ? ?No orders of the defined types were placed in this encounter. ? ?Has a history of leukocytosis therefore CBC not evaluated. ? ?If your symptoms get worse, please call 911 / EMS for help or go to the Emergency Department for further evaluation. ? ?Jake Shark, PA-C ? ?

## 2022-03-20 ENCOUNTER — Other Ambulatory Visit: Payer: Self-pay | Admitting: Family Medicine

## 2022-03-20 NOTE — Telephone Encounter (Signed)
[facility]. Please advise [facility] ?

## 2022-04-28 ENCOUNTER — Ambulatory Visit: Payer: BC Managed Care – PPO | Admitting: Family Medicine

## 2022-08-28 ENCOUNTER — Other Ambulatory Visit: Payer: Self-pay | Admitting: Family Medicine

## 2022-08-28 NOTE — Telephone Encounter (Signed)
[facility] albterol. Please advise [facility]

## 2022-11-07 ENCOUNTER — Ambulatory Visit (INDEPENDENT_AMBULATORY_CARE_PROVIDER_SITE_OTHER): Payer: BC Managed Care – PPO | Admitting: Family Medicine

## 2022-11-07 ENCOUNTER — Encounter: Payer: Self-pay | Admitting: Family Medicine

## 2022-11-07 VITALS — BP 120/80 | HR 87 | Ht 70.0 in | Wt 346.6 lb

## 2022-11-07 DIAGNOSIS — G4733 Obstructive sleep apnea (adult) (pediatric): Secondary | ICD-10-CM

## 2022-11-07 DIAGNOSIS — J4540 Moderate persistent asthma, uncomplicated: Secondary | ICD-10-CM

## 2022-11-07 DIAGNOSIS — Z0000 Encounter for general adult medical examination without abnormal findings: Secondary | ICD-10-CM

## 2022-11-07 DIAGNOSIS — Z23 Encounter for immunization: Secondary | ICD-10-CM | POA: Diagnosis not present

## 2022-11-07 DIAGNOSIS — R7301 Impaired fasting glucose: Secondary | ICD-10-CM

## 2022-11-07 LAB — POCT GLYCOSYLATED HEMOGLOBIN (HGB A1C): Hemoglobin A1C: 5.7 % — AB (ref 4.0–5.6)

## 2022-11-07 MED ORDER — FLUTICASONE FUROATE-VILANTEROL 200-25 MCG/ACT IN AEPB: INHALATION_SPRAY | RESPIRATORY_TRACT | 3 refills | Status: DC

## 2022-11-07 MED ORDER — ALBUTEROL SULFATE HFA 108 (90 BASE) MCG/ACT IN AERS: 2.0000 | INHALATION_SPRAY | Freq: Four times a day (QID) | RESPIRATORY_TRACT | 0 refills | Status: DC | PRN

## 2022-11-07 NOTE — Progress Notes (Signed)
Complete physical exam  Patient: Gregory Preston   DOB: 09/03/1976   46 y.o. Male  MRN: [id number]  Subjective:    Chief Complaint  Patient presents with   Annual Exam    Annual physical exam. Fasting. No additional concerns.    Gregory Preston is a 46 y.o. male who presents today for a complete physical exam. He reports consuming a  regular diet  diet.  He has made diet and exercise changes and has so far lost over 30 pounds.  The patient does not participate in regular exercise at present. He does have a job that requires a lot of physical activity.He generally feels fairly well. He reports sleeping fairly well.  He also states that he apparently was evaluated for sleep apnea through [facility] sleep medicine and told he did not need to have testing done.  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  He has had intermittent difficulty with back pain and has seen chiropractor on several occasions and told he had a leg length discrepancy and was given physical therapy exercises which has helped.  He continues on his asthma medication and is having no difficulty with that.  He rarely uses the rescue inhaler.  Does have a history of glucose intolerance. Most recent fall risk assessment:    01/19/2022   11:38 AM  Fall Risk   Falls in the past year? 0  Number falls in past yr: 0  Injury with Fall? 0  Risk for fall due to : No Fall Risks  Follow up Falls prevention discussed     Most recent depression screenings:    11/07/2022    9:38 AM 10/21/2021    2:23 PM  PHQ 2/9 Scores  PHQ - 2 Score 0 0    Vision:Within last year and Dental: No current dental problems and No regular dental care     Patient Care Team: Ronnald Nian, MD as PCP - General (Family Medicine)   Outpatient Medications Prior to Visit  Medication Sig Note   albuterol (VENTOLIN HFA) 108 (90 Base) MCG/ACT inhaler INHALE 2 PUFFS EVERY 6 HOURS AS NEEDED FOR WHEEZING/SHORTNESS OF BREATH    BREO ELLIPTA  200-25 MCG/ACT AEPB INHALE 1 PUFF BY MOUTH EVERY DAY    ibuprofen (ADVIL) 200 MG tablet Take 400 mg by mouth every 6 (six) hours as needed. 11/07/2022: Last dose 2 weeks ago.    Ibuprofen-diphenhydrAMINE Cit (ADVIL PM PO) Take 400 mg by mouth as needed. (Patient not taking: Reported on 11/07/2022)    No facility-administered medications prior to visit.    Review of Systems  All other systems reviewed and are negative.         Objective:     BP 120/80   Pulse 87   Ht 5\' 10"  (1.778 m)   Wt (!) 346 lb 9.6 oz (157.2 kg)   SpO2 98% Comment: room  air  BMI 49.73 kg/m    Physical Exam  Alert and in no distress. Tympanic membranes and canals are normal. Pharyngeal area is normal. Neck is supple without adenopathy or thyromegaly. Cardiac exam shows a regular sinus rhythm without murmurs or gallops. Lungs are clear to auscultation. Hemoglobin A1c is 5.7 Results for orders placed or performed in visit on 11/07/22  POCT glycosylated hemoglobin (Hb A1C)  Result Value Ref Range   Hemoglobin A1C 5.7 (A) 4.0 - 5.6 %       Assessment & Plan:    Routine  general medical examination at a health care facility - Plan: CBC with Differential/Platelet, Lipid panel, Comprehensive metabolic panel  Moderate persistent asthma in adult without complication  Impaired fasting blood sugar - Plan: POCT glycosylated hemoglobin (Hb A1C)  Morbid obesity (HCC) - Plan: CBC with Differential/Platelet, Lipid panel, Comprehensive metabolic panel  Need for influenza vaccination - Plan: Flu Vaccine QUAD 6+ mos PF IM (Fluarix Quad PF)  Immunization History  Administered Date(s) Administered   COVID-19, mRNA, vaccine(Comirnaty)12 years and older 08/25/2022   Influenza,inj,Quad PF,6+ Mos 10/21/2021, 11/07/2022   PFIZER(Purple Top)SARS-COV-2 Vaccination 02/19/2020, 03/15/2020, 10/26/2020   Pfizer Covid-19 Vaccine Bivalent Booster 9yrs & up 09/03/2021   Pneumococcal Polysaccharide-23 08/23/2012, 10/21/2021    Tdap 04/25/2006, 10/21/2021    Health Maintenance  Topic Date Due   HIV Screening  Never done   Fecal DNA (Cologuard)  11/28/2024   DTaP/Tdap/Td (3 - Td or Tdap) 10/22/2031   INFLUENZA VACCINE  Completed   COVID-19 Vaccine  Completed   Hepatitis C Screening  Completed   HPV VACCINES  Aged Out    I complemented him on the weight that he has lost and encouraged him to continue with this.  Discussed setting a goal of the particular waist size rather than the weight.  Also discussed the use of chiropractic and since he does benefit from it, continuing to pursue this on an as-needed basis is reasonable. Problem List Items Addressed This Visit     Asthma in adult   Impaired fasting blood sugar   Relevant Orders   POCT glycosylated hemoglobin (Hb A1C) (Completed)   Morbid obesity (HCC)   Relevant Orders   CBC with Differential/Platelet   Lipid panel   Comprehensive metabolic panel   Other Visit Diagnoses     Routine general medical examination at a health care facility    -  Primary   Relevant Orders   CBC with Differential/Platelet   Lipid panel   Comprehensive metabolic panel   Need for influenza vaccination       Relevant Orders   Flu Vaccine QUAD 6+ mos PF IM (Fluarix Quad PF) (Completed)      Recheck 1 year.   Sharlot Gowda, MD

## 2022-11-08 LAB — CBC WITH DIFFERENTIAL/PLATELET
Basophils Absolute: 0 10*3/uL (ref 0.0–0.2)
Basos: 0 %
EOS (ABSOLUTE): 0.3 10*3/uL (ref 0.0–0.4)
Eos: 2 %
Hematocrit: 46.9 % (ref 37.5–51.0)
Hemoglobin: 15.5 g/dL (ref 13.0–17.7)
Immature Grans (Abs): 0 10*3/uL (ref 0.0–0.1)
Immature Granulocytes: 0 %
Lymphocytes Absolute: 3.3 10*3/uL — ABNORMAL HIGH (ref 0.7–3.1)
Lymphs: 25 %
MCH: 30 pg (ref 26.6–33.0)
MCHC: 33 g/dL (ref 31.5–35.7)
MCV: 91 fL (ref 79–97)
Monocytes Absolute: 0.8 10*3/uL (ref 0.1–0.9)
Monocytes: 6 %
Neutrophils Absolute: 8.6 10*3/uL — ABNORMAL HIGH (ref 1.4–7.0)
Neutrophils: 67 %
Platelets: 261 10*3/uL (ref 150–450)
RBC: 5.17 x10E6/uL (ref 4.14–5.80)
RDW: 12.5 % (ref 11.6–15.4)
WBC: 13.1 10*3/uL — ABNORMAL HIGH (ref 3.4–10.8)

## 2022-11-08 LAB — COMPREHENSIVE METABOLIC PANEL
ALT: 19 IU/L (ref 0–44)
AST: 18 IU/L (ref 0–40)
Albumin/Globulin Ratio: 1.7 (ref 1.2–2.2)
Albumin: 4.6 g/dL (ref 4.1–5.1)
Alkaline Phosphatase: 66 IU/L (ref 44–121)
BUN/Creatinine Ratio: 12 (ref 9–20)
BUN: 12 mg/dL (ref 6–24)
Bilirubin Total: 0.5 mg/dL (ref 0.0–1.2)
CO2: 20 mmol/L (ref 20–29)
Calcium: 9.9 mg/dL (ref 8.7–10.2)
Chloride: 104 mmol/L (ref 96–106)
Creatinine, Ser: 1.01 mg/dL (ref 0.76–1.27)
Globulin, Total: 2.7 g/dL (ref 1.5–4.5)
Glucose: 87 mg/dL (ref 70–99)
Potassium: 4.5 mmol/L (ref 3.5–5.2)
Sodium: 139 mmol/L (ref 134–144)
Total Protein: 7.3 g/dL (ref 6.0–8.5)
eGFR: 92 mL/min/{1.73_m2} (ref >59)

## 2022-11-08 LAB — LIPID PANEL
Chol/HDL Ratio: 3.5 ratio (ref 0.0–5.0)
Cholesterol, Total: 184 mg/dL (ref 100–199)
HDL: 52 mg/dL (ref >39)
LDL Chol Calc (NIH): 107 mg/dL — ABNORMAL HIGH (ref 0–99)
Triglycerides: 143 mg/dL (ref 0–149)
VLDL Cholesterol Cal: 25 mg/dL (ref 5–40)

## 2022-12-04 ENCOUNTER — Other Ambulatory Visit: Payer: Self-pay | Admitting: Family Medicine

## 2022-12-04 DIAGNOSIS — J4540 Moderate persistent asthma, uncomplicated: Secondary | ICD-10-CM

## 2022-12-04 NOTE — Telephone Encounter (Signed)
Refill request last apt 11/07/22.

## 2023-01-17 ENCOUNTER — Telehealth: Payer: Self-pay | Admitting: Internal Medicine

## 2023-01-17 NOTE — Telephone Encounter (Signed)
starting April 1st, pt's insurance will not cover current medication Albuteral PA IN HFA 200. Preferred medication is albuterol sulfate aerosol, levalbuterol tartrate aerosol

## 2023-01-18 MED ORDER — ALBUTEROL SULFATE HFA 108 (90 BASE) MCG/ACT IN AERS: 2.0000 | INHALATION_SPRAY | Freq: Four times a day (QID) | RESPIRATORY_TRACT | 0 refills | Status: DC | PRN

## 2023-01-18 NOTE — Telephone Encounter (Signed)
Refilled to aerosol albuterol

## 2023-02-21 ENCOUNTER — Other Ambulatory Visit: Payer: Self-pay | Admitting: *Deleted

## 2023-02-21 MED ORDER — LEVALBUTEROL TARTRATE 45 MCG/ACT IN AERO: 2.0000 | INHALATION_SPRAY | RESPIRATORY_TRACT | 0 refills | Status: DC | PRN

## 2023-04-23 ENCOUNTER — Encounter: Payer: Self-pay | Admitting: Emergency Medicine

## 2023-04-23 ENCOUNTER — Emergency Department
Admission: EM | Admit: 2023-04-23 | Discharge: 2023-04-23 | Disposition: A | Discharge status: Home / Self Care | Payer: BC Managed Care – PPO | Attending: Emergency Medicine | Admitting: Emergency Medicine

## 2023-04-23 ENCOUNTER — Emergency Department: Payer: BC Managed Care – PPO

## 2023-04-23 ENCOUNTER — Other Ambulatory Visit: Payer: Self-pay

## 2023-04-23 DIAGNOSIS — M25511 Pain in right shoulder: Secondary | ICD-10-CM | POA: Diagnosis present

## 2023-04-23 DIAGNOSIS — J45909 Unspecified asthma, uncomplicated: Secondary | ICD-10-CM | POA: Insufficient documentation

## 2023-04-23 MED ORDER — CYCLOBENZAPRINE HCL 5 MG PO TABS: 5.0000 mg | ORAL_TABLET | Freq: Three times a day (TID) | ORAL | 0 refills | Status: DC | PRN

## 2023-04-23 MED ORDER — KETOROLAC TROMETHAMINE 30 MG/ML IJ SOLN
30.0000 mg | Freq: Once | INTRAMUSCULAR | Status: AC
  Administered 2023-04-23: 30 mg via INTRAMUSCULAR
  Filled 2023-04-23: qty 1

## 2023-04-23 MED ORDER — LIDOCAINE 5 % EX PTCH: 1.0000 | MEDICATED_PATCH | Freq: Two times a day (BID) | CUTANEOUS | 0 refills | Status: DC

## 2023-04-23 MED ORDER — LIDOCAINE 5 % EX PTCH
1.0000 | MEDICATED_PATCH | CUTANEOUS | Status: DC
  Administered 2023-04-23: 1 via TRANSDERMAL
  Filled 2023-04-23: qty 1

## 2023-04-23 NOTE — ED Notes (Signed)
See triage note  Presents with pain to right shoulder  States pain started about 1 week ago   Denies any injury  No deformity noted

## 2023-04-23 NOTE — ED Triage Notes (Addendum)
Pt arrived via POV with c/o R shoulder pain x 1 week, c/o difficulty sleeping due to the pain. Pt has good ROM.

## 2023-04-23 NOTE — ED Provider Notes (Signed)
[facility] Provider Note    Event Date/Time   First MD Initiated Contact with Patient 04/23/23 [phone]     (approximate)   History   Chief Complaint Shoulder Pain   HPI  Gregory Preston is a 47 y.o. male with past medical history of asthma and peptic ulcer disease who presents to the ED complaining of shoulder pain.  Patient reports that he has been dealing with about 3 days of increasing pain in his right shoulder that radiates down the posterior part of his upper arm.  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  He denies any numbness or weakness in the arm, has not noticed any redness or swelling.  He has been taking ibuprofen with partial relief of his symptoms.     Physical Exam   Triage Vital Signs: ED Triage Vitals  Enc Vitals Group     BP 04/23/23 0653 135/86     Pulse Rate 04/23/23 0653 76     Resp 04/23/23 0653 20     Temp 04/23/23 0653 97.9 F (36.6 C)     Temp Source 04/23/23 0653 Oral     SpO2 04/23/23 0653 96 %     Weight 04/23/23 0654 (!) 330 lb (149.7 kg)     Height 04/23/23 0654 5\' 11"  (1.803 m)     Head Circumference --      Peak Flow --      Pain Score 04/23/23 0651 10     Pain Loc --      Pain Edu? --      Excl. in GC? --     Most recent vital signs: Vitals:   04/23/23 0653  BP: 135/86  Pulse: 76  Resp: 20  Temp: 97.9 F (36.6 C)  SpO2: 96%    Constitutional: Alert and oriented. Eyes: Conjunctivae are normal. Head: Atraumatic. Nose: No congestion/rhinnorhea. Mouth/Throat: Mucous membranes are moist.  Cardiovascular: Normal rate, regular rhythm. Grossly normal heart sounds.  2+ radial pulses bilaterally. Respiratory: Normal respiratory effort.  No retractions. Lungs CTAB. Gastrointestinal: Soft and nontender. No distention. Musculoskeletal: No lower extremity  tenderness nor edema.  Diffuse tenderness to palpation of right shoulder with no obvious deformity. Neurologic:  Normal speech and language. No gross focal neurologic deficits are appreciated.    ED Results / Procedures / Treatments   Labs (all labs ordered are listed, but only abnormal results are displayed) Labs Reviewed - No data to display  RADIOLOGY Right shoulder x-ray reviewed and interpreted by me with no fracture or dislocation.  PROCEDURES:  Critical Care performed: No  Procedures   MEDICATIONS ORDERED IN ED: Medications  ketorolac (TORADOL) 30 MG/ML injection 30 mg (has no administration in time range)  lidocaine (LIDODERM) 5 % 1 patch (has no administration in time range)     IMPRESSION / MDM / ASSESSMENT AND PLAN / ED COURSE  I reviewed the triage vital signs and the nursing notes.                              47 y.o. male with past medical history of asthma and peptic ulcer disease who presents to the ED complaining of 3 days of increasing right shoulder pain.  Patient's presentation is most consistent with acute complicated illness /  injury requiring diagnostic workup.  Differential diagnosis includes, but is not limited to, fracture, dislocation, arthritis, radiculopathy, overuse injury.  Patient well-appearing and in no acute distress, vital signs are unremarkable.  He remains neurovascular intact to his distal right upper extremity with strong radial pulse and good strength.  No signs of infection on exam and x-ray is unremarkable.  We will treat with IM Toradol and Lidoderm patch, patient counseled on rest and will be provided with a note for a couple of days off of work.  He was provided with referral to follow-up with orthopedics as needed and counseled to return to the ED for new or worsening symptoms.  Patient agrees with plan.      FINAL CLINICAL IMPRESSION(S) / ED DIAGNOSES   Final diagnoses:  Acute pain of right shoulder     Rx / DC Orders    ED Discharge Orders          Ordered    lidocaine (LIDODERM) 5 %  Every 12 hours        04/23/23 0738    cyclobenzaprine (FLEXERIL) 5 MG tablet  3 times daily PRN        04/23/23 1610             Note:  This document was prepared using Dragon voice recognition software and may include unintentional dictation errors.   Chesley Noon, MD 04/23/23 [phone]

## 2023-04-24 ENCOUNTER — Telehealth: Payer: Self-pay | Admitting: Family Medicine

## 2023-04-24 NOTE — Telephone Encounter (Signed)
Called pt spoke with Wife Annabelle Harman and she is on HIPAA.  Dr Susann Givens wants pt to come in for hosp follow up with him instead of going to ortho.  Wife will check and call us back.

## 2023-04-29 ENCOUNTER — Encounter: Payer: Self-pay | Admitting: Emergency Medicine

## 2023-04-29 ENCOUNTER — Other Ambulatory Visit: Payer: Self-pay

## 2023-04-29 ENCOUNTER — Emergency Department
Admission: EM | Admit: 2023-04-29 | Discharge: 2023-04-29 | Disposition: A | Discharge status: Home / Self Care | Payer: BC Managed Care – PPO | Attending: Emergency Medicine | Admitting: Emergency Medicine

## 2023-04-29 DIAGNOSIS — M79601 Pain in right arm: Secondary | ICD-10-CM | POA: Diagnosis present

## 2023-04-29 DIAGNOSIS — J45909 Unspecified asthma, uncomplicated: Secondary | ICD-10-CM | POA: Diagnosis not present

## 2023-04-29 DIAGNOSIS — M5412 Radiculopathy, cervical region: Secondary | ICD-10-CM | POA: Insufficient documentation

## 2023-04-29 MED ORDER — PREDNISONE 10 MG (21) PO TBPK: ORAL_TABLET | ORAL | 0 refills | Status: DC

## 2023-04-29 MED ORDER — DEXAMETHASONE SODIUM PHOSPHATE 10 MG/ML IJ SOLN
10.0000 mg | Freq: Once | INTRAMUSCULAR | Status: AC
  Administered 2023-04-29: 10 mg via INTRAMUSCULAR
  Filled 2023-04-29: qty 1

## 2023-04-29 NOTE — ED Provider Notes (Signed)
[facility] Provider Note    Event Date/Time   First MD Initiated Contact with Patient 04/29/23 [phone]     (approximate)   History   Shoulder Pain   HPI  Gregory Preston is a 47 y.o. male with a past medical history of morbid obesity who presents today for evaluation of right arm pain.  Patient reports that this has been ongoing for the past couple of weeks.  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  He also notices that his pain is worse when he moves his neck, and has pain that shoots down to his fingers.  He denies any weakness in his arm.  He denies decreased grip strength.  No fevers or chills.  No history of IV drug use.  He reports that he came here last week and was diagnosed with a shoulder strain, but the medications that were prescribed are not helping him.  Patient Active Problem List   Diagnosis Date Noted   Leukocytosis 11/09/2021   Dysfunction of eustachian tube 11/09/2021   OSA (obstructive sleep apnea) 06/11/2018   Asthma in adult 11/22/2011   Morbid obesity (HCC) 10/09/2011   Impaired fasting blood sugar 10/09/2011          Physical Exam   Triage Vital Signs: ED Triage Vitals [04/29/23 0714]  Enc Vitals Group     BP 135/89     Pulse Rate 96     Resp 18     Temp 98.4 F (36.9 C)     Temp Source Oral     SpO2 100 %     Weight (!) 330 lb 0.5 oz (149.7 kg)     Height 5\' 11"  (1.803 m)     Head Circumference      Peak Flow      Pain Score 10     Pain Loc      Pain Edu?      Excl. in GC?     Most recent vital signs: Vitals:   04/29/23 0714  BP: 135/89  Pulse: 96  Resp: 18  Temp: 98.4 F (36.9 C)  SpO2: 100%    Physical Exam Vitals and nursing note reviewed.  Constitutional:      General: Awake and alert. No acute distress.    Appearance: Normal appearance. The patient is obese.  HENT:     Head: Normocephalic and atraumatic.     Mouth: Mucous membranes are moist.  Eyes:     General: PERRL.  Normal EOMs        Right eye: No discharge.        Left eye: No discharge.     Conjunctiva/sclera: Conjunctivae normal.  Cardiovascular:     Rate and Rhythm: Normal rate and regular rhythm.     Pulses: Normal pulses.     Heart sounds: Normal heart sounds Pulmonary:     Effort: Pulmonary effort is normal. No respiratory distress.     Breath sounds: Normal breath sounds.  Abdominal:     Abdomen is soft. There is no abdominal tenderness. No rebound or guarding. No distention. Musculoskeletal:        General: No swelling. Normal range of motion.     Cervical back: Normal range of motion and neck supple. No midline cervical spine tenderness.  Full range of motion of neck.  Negative Spurling test.  Negative Lhermitte sign.  Normal strength and sensation in bilateral upper extremities. Normal grip strength bilaterally.  Normal  intrinsic muscle function of the hand bilaterally.  Normal radial pulses bilaterally. Right shoulder: No obvious deformity, swelling, ecchymosis, or erythema No clavicular or AC joint tenderness Able to actively and passively forward flex and abduct at shoulder fully, negative drop arm test Negative Obriens, SLAP, empty can, and lift off tests Normal internal and external rotation against resistance Negative Hawkins and Neers Normal ROM at elbow and wrist Normal resisted pronation and supination 2+ radial pulse Normal grip strength Normal intrinsic hand muscle function Skin:    General: Skin is warm and dry.     Capillary Refill: Capillary refill takes less than 2 seconds.     Findings: No rash.  Neurological:     Mental Status: The patient is awake and alert.      ED Results / Procedures / Treatments   Labs (all labs ordered are listed, but only abnormal results are displayed) Labs Reviewed - No data to display   EKG     RADIOLOGY     PROCEDURES:  Critical Care performed:   Procedures   MEDICATIONS ORDERED IN ED: Medications   dexamethasone (DECADRON) injection 10 mg (10 mg Intramuscular Given 04/29/23 0836)     IMPRESSION / MDM / ASSESSMENT AND PLAN / ED COURSE  I reviewed the triage vital signs and the nursing notes.   Differential diagnosis includes, but is not limited to, cervical radiculopathy, rotator cuff strain, less likely fracture or dislocation.  Patient is awake and alert, hemodynamically stable and afebrile.  He has normal grip strength, equal bilaterally, normal strength and sensation of bilateral upper extremities, equal bilaterally, not consistent with central cord syndrome.  He does have a positive Spurling sign to the right, raising suspicion for cervical radiculopathy.  He has full and normal range of motion of his shoulder, negative O'Brien's test, negative SLAP test, do not suspect rotator cuff injury.  No history of IV drug use to suggest epidural abscess.  No hemodynamic instability or fever.  Not anticoagulated, no major trauma, do not suspect epidural hematoma.  History and exam most consistent with cervical radiculopathy.  He was noted on a prednisone taper and instructed to follow-up with spine surgery.  The appropriate follow-up information was provided.  In meantime we discussed return precautions.  Patient understands and agrees with plan.  He was discharged in stable condition.   Patient's presentation is most consistent with acute complicated illness / injury requiring diagnostic workup.    FINAL CLINICAL IMPRESSION(S) / ED DIAGNOSES   Final diagnoses:  Cervical radiculopathy     Rx / DC Orders   ED Discharge Orders          Ordered    predniSONE (STERAPRED UNI-PAK 21 TAB) 10 MG (21) TBPK tablet        04/29/23 4098             Note:  This document was prepared using Dragon voice recognition software and may include unintentional dictation errors.   Keturah Shavers 04/29/23 1103    Jene Every, MD 04/29/23 1256

## 2023-04-29 NOTE — Discharge Instructions (Addendum)
You may start taking the medication as prescribed.  Please follow-up with the neurosurgeon for further management.  Please return if you develop weakness in your arm.  Please return for any other new, worsening, or change in symptoms or other concerns.  Was a pleasure caring for you today.

## 2023-04-29 NOTE — ED Triage Notes (Signed)
Pt here with right shoulder and right arm pain x2 weeks. Pt states he is having a constant pain in his arm and now it is radiating across his chest to his left arm. Pt denies chest pain but states when he turns his head the pain is worse.

## 2023-05-16 ENCOUNTER — Ambulatory Visit (INDEPENDENT_AMBULATORY_CARE_PROVIDER_SITE_OTHER): Payer: BC Managed Care – PPO | Admitting: Medical

## 2023-05-16 ENCOUNTER — Encounter: Payer: Self-pay | Admitting: Medical

## 2023-05-16 VITALS — BP 120/70 | HR 84 | Ht 71.0 in | Wt 349.6 lb

## 2023-05-16 DIAGNOSIS — M79601 Pain in right arm: Secondary | ICD-10-CM | POA: Diagnosis not present

## 2023-05-16 DIAGNOSIS — R202 Paresthesia of skin: Secondary | ICD-10-CM

## 2023-05-16 DIAGNOSIS — M5412 Radiculopathy, cervical region: Secondary | ICD-10-CM

## 2023-05-16 DIAGNOSIS — M79609 Pain in unspecified limb: Secondary | ICD-10-CM | POA: Diagnosis not present

## 2023-05-16 MED ORDER — HYDROCODONE-ACETAMINOPHEN 5-325 MG PO TABS: 1.0000 | ORAL_TABLET | Freq: Two times a day (BID) | ORAL | 0 refills | Status: DC | PRN

## 2023-05-16 MED ORDER — GABAPENTIN 100 MG PO CAPS: 100.0000 mg | ORAL_CAPSULE | Freq: Two times a day (BID) | ORAL | 1 refills | Status: DC

## 2023-05-16 NOTE — Patient Instructions (Signed)
Recommendations for pain control You can continue over the counter Tylenol 1-2 times daily, using the 500mg  dose, such as tylenol 500mg  in morning and early afternoon. I do recommend you continue Aleve over the counter 1 tablet twice daily for at least the next 7 days, then as needed.  This is for pain and inflammation, at breakfast and evening or at dinner time for example Begin Gabapentin 100mg .  Start 1 capsule at night time.   After 5 days you can increase to 2 capsules at bedtime if not seeing a lot of improvement Ultimately over the next 2 weeks you can increase to twice daily if not seeing improvement.  For example you can increase to 100mg  in the morning and 200mg  at night after 2 weeks if not seeing improvement I am also prescribing short term Hydrocodone pain medicaiton for breakthrough pain.  Use this sparingly up to 2 times daily if needed.   It contains tylenol so don't double up on this medicaiton and over the counter tylenol without 4 hours of each other Caution as Gabapentin and Norco can cause drowsiness

## 2023-05-16 NOTE — Progress Notes (Signed)
Subjective:  Gregory Preston is a 47 y.o. male who presents for Chief Complaint  Patient presents with   Arm Pain    Seen at ER 6/3 and 6/9 for right arm/shoulder pain. Diagnosed with cervical radiculopathy. Pain is not getting any better. Has had several/prednisone taper. Pain is worsening even though he is taking tylenol and aleve. Worse when he lays down. Saw chiro Monday and he did Xrays and said C5-C6 compression.      Here for 4 week hx/o pain in right arm.   Initially went to emergency dept 04/23/23 for eval.  Advised that he could have shoulder or arm issues, was initially prescribed muscle relaxers, shot of tramadol. That didn't help much.  Worked that week, got worse, went back to emergency dept 04/29/23.  At that time would have constant pain in right arm, but at times shooting pains down arm including tingling and pain in fingers.   Went to chiropractor this past week, had eval and xrays, advised he had compression C5-C6.  Sees chiropractor again tomorrow.  Here because pain is still moderate, "terrible."  Did prednisone taper and that didn't help.  Using tylenol in morning, aleve at night.   No other aggravating or relieving factors.    No other c/o.  Past Medical History:  Diagnosis Date   Allergy    Asthma    Eczema    Hand fracture, right 01/2012   Peptic ulcer disease 11/11   s/p NSAID use, resolved   Wears glasses    Current Outpatient Medications on File Prior to Visit  Medication Sig Dispense Refill   acetaminophen (TYLENOL) 500 MG tablet Take 1,000 mg by mouth every 6 (six) hours as needed.     albuterol (VENTOLIN HFA) 108 (90 Base) MCG/ACT inhaler Inhale 1-2 puffs into the lungs every 4 (four) hours as needed.     fluticasone furoate-vilanterol (BREO ELLIPTA) 200-25 MCG/ACT AEPB INHALE 1 PUFF BY MOUTH EVERY DAY 180 each 3   Naproxen Sod-diphenhydrAMINE (ALEVE PM) 220-25 MG TABS Take 2 tablets by mouth at bedtime.     No current facility-administered medications on file  prior to visit.    The following portions of the patient's history were reviewed and updated as appropriate: allergies, current medications, past family history, past medical history, past social history, past surgical history and problem list.  ROS Otherwise as in subjective above    Objective: BP 120/70   Pulse 84   Ht 5\' 11"  (1.803 m)   Wt (!) 349 lb 9.6 oz (158.6 kg)   BMI 48.76 kg/m   General appearance: alert, no distress, well developed, well nourished He seems to be in pain moving his right arm in general, otherwise Neck nontender with normal range of motion, no lymphadenopathy or mass Right arm nontender with relatively normal range of motion, no swelling or deformity Arms neurovascularly intact Overall exam unremarkable   Assessment: Encounter Diagnoses  Name Primary?   Cervical radiculopathy Yes   Paresthesia and pain of right extremity    Arm pain, diffuse, right      Plan: I do not have access from his recent x-rays of neck through chiropractor.  I reviewed his recent emergency department notes and imaging of shoulder.  We discussed his cervical radiculopathy symptoms.  We discussed following recommendations  Continue with chiropractic therapy and recheck here in 3 to 4 weeks  Patient Instructions  Recommendations for pain control You can continue over the counter Tylenol 1-2 times daily, using the 500mg   dose, such as tylenol 500mg  in morning and early afternoon. I do recommend you continue Aleve over the counter 1 tablet twice daily for at least the next 7 days, then as needed.  This is for pain and inflammation, at breakfast and evening or at dinner time for example Begin Gabapentin 100mg .  Start 1 capsule at night time.   After 5 days you can increase to 2 capsules at bedtime if not seeing a lot of improvement Ultimately over the next 2 weeks you can increase to twice daily if not seeing improvement.  For example you can increase to 100mg  in the morning  and 200mg  at night after 2 weeks if not seeing improvement I am also prescribing short term Hydrocodone pain medicaiton for breakthrough pain.  Use this sparingly up to 2 times daily if needed.   It contains tylenol so don't double up on this medicaiton and over the counter tylenol without 4 hours of each other Caution as Gabapentin and Norco can cause drowsiness    Gregory Preston was seen today for arm pain.  Diagnoses and all orders for this visit:  Cervical radiculopathy  Paresthesia and pain of right extremity  Arm pain, diffuse, right  Other orders -     gabapentin (NEURONTIN) 100 MG capsule; Take 1 capsule (100 mg total) by mouth 2 (two) times daily. -     HYDROcodone-acetaminophen (NORCO) 5-325 MG tablet; Take 1 tablet by mouth 2 (two) times daily as needed.    Follow up: 3-4 weeks

## 2023-07-04 ENCOUNTER — Other Ambulatory Visit: Payer: Self-pay | Admitting: Family Medicine

## 2023-07-04 ENCOUNTER — Ambulatory Visit (INDEPENDENT_AMBULATORY_CARE_PROVIDER_SITE_OTHER): Payer: BC Managed Care – PPO

## 2023-07-04 ENCOUNTER — Other Ambulatory Visit (HOSPITAL_BASED_OUTPATIENT_CLINIC_OR_DEPARTMENT_OTHER): Payer: Self-pay

## 2023-07-04 ENCOUNTER — Ambulatory Visit (INDEPENDENT_AMBULATORY_CARE_PROVIDER_SITE_OTHER): Payer: BC Managed Care – PPO | Admitting: Student

## 2023-07-04 DIAGNOSIS — M25511 Pain in right shoulder: Secondary | ICD-10-CM | POA: Diagnosis not present

## 2023-07-04 DIAGNOSIS — M5412 Radiculopathy, cervical region: Secondary | ICD-10-CM | POA: Diagnosis not present

## 2023-07-04 DIAGNOSIS — M542 Cervicalgia: Secondary | ICD-10-CM

## 2023-07-04 MED ORDER — MELOXICAM 15 MG PO TABS
15.0000 mg | ORAL_TABLET | Freq: Every day | ORAL | 0 refills | Status: AC
  Filled 2023-07-04: qty 10, 10d supply, fill #0

## 2023-07-04 MED ORDER — METHOCARBAMOL 500 MG PO TABS
500.0000 mg | ORAL_TABLET | Freq: Three times a day (TID) | ORAL | 0 refills | Status: AC | PRN
  Filled 2023-07-04: qty 42, 14d supply, fill #0

## 2023-07-04 NOTE — Progress Notes (Signed)
Chief Complaint: Right shoulder and arm pain     History of Present Illness:    Gregory Preston is a 47 y.o. male presenting today for evaluation of right shoulder pain.  This began back in early June with no known injury and has been worsening since.  He has pain over the lateral and anterior shoulder that radiates down the arm toward the elbow.  He also has numbness and tingling over the lateral aspect of the forearm into the first and second fingers.  Denies any neck pain today, however certain motion such as cervical to rotation to the right exacerbate the paresthesias down his arm.  Pain is moderate at baseline and sometimes does become severe.  This is made worse with laying down and as result he has been having trouble sleeping.  He did see his chiropractor for 6 weeks which did help him regain some range of motion in his neck, however his pain and radicular symptoms have continued.  He has been taking ibuprofen relief with Tylenol with little relief.  He has previously had a Toradol injection and course of oral prednisone which did not help.  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   Surgical History:   None  PMH/PSH/Family History/Social History/Meds/Allergies:    Past Medical History:  Diagnosis Date   Allergy    Asthma    Eczema    Hand fracture, right 01/2012   Peptic ulcer disease 11/11   s/p NSAID use, resolved   Wears glasses    Past Surgical History:  Procedure Laterality Date   BONE CYST EXCISION  10/11   mandible cyst excised   Social History   Socioeconomic History   Marital status: Married    Spouse name: Not on file   Number of children: Not on file   Years of education: Not on file   Highest education level: Not on file  Occupational History   Occupation: has a Aeronautical engineer business  Tobacco Use   Smoking status: Former    Current packs/day: 0.25    Average packs/day: 0.3 packs/day for 10.0 years (2.5 ttl pk-yrs)     Types: Cigars, Cigarettes   Smokeless tobacco: Never  Substance and Sexual Activity   Alcohol use: Yes    Alcohol/week: 2.0 standard drinks of alcohol    Types: 2 Standard drinks or equivalent per week    Comment: Rare   Drug use: Yes    Types: Marijuana   Sexual activity: Yes    Comment: married;  does landscaping; exercise through work, otherwise no  Other Topics Concern   Not on file  Social History Narrative   Married, exercise - landscaping at work; no children   Social Determinants of Corporate investment banker Strain: Not on file  Food Insecurity: Not on file  Transportation Needs: Not on file  Physical Activity: Not on file  Stress: Not on file  Social Connections: Unknown (04/04/2022)   [facility]   Social Network    Social Network: Not on file   Family History  Problem Relation Age of Onset   Peripheral vascular disease Father    Heart disease Father        pacemaker   COPD Father    Heart disease Maternal Grandfather    Hypertension Neg Hx  Hyperlipidemia Neg Hx    Stroke Neg Hx    Diabetes Neg Hx    Cancer Neg Hx    No Known Allergies Current Outpatient Medications  Medication Sig Dispense Refill   meloxicam (MOBIC) 15 MG tablet Take 1 tablet (15 mg total) by mouth daily for 10 days. 10 tablet 0   methocarbamol (ROBAXIN) 500 MG tablet Take 1 tablet (500 mg total) by mouth every 8 (eight) hours as needed for up to 14 days for muscle spasms. 42 tablet 0   acetaminophen (TYLENOL) 500 MG tablet Take 1,000 mg by mouth every 6 (six) hours as needed.     albuterol (VENTOLIN HFA) 108 (90 Base) MCG/ACT inhaler Inhale 1-2 puffs into the lungs every 4 (four) hours as needed.     fluticasone furoate-vilanterol (BREO ELLIPTA) 200-25 MCG/ACT AEPB INHALE 1 PUFF BY MOUTH EVERY DAY 180 each 3   gabapentin (NEURONTIN) 100 MG capsule Take 1 capsule (100 mg total) by mouth 2 (two) times daily. 60 capsule 1   HYDROcodone-acetaminophen (NORCO) 5-325 MG  tablet Take 1 tablet by mouth 2 (two) times daily as needed. 20 tablet 0   Naproxen Sod-diphenhydrAMINE (ALEVE PM) 220-25 MG TABS Take 2 tablets by mouth at bedtime.     No current facility-administered medications for this visit.   No results found.  Review of Systems:   A ROS was performed including pertinent positives and negatives as documented in the HPI.  Physical Exam :   Constitutional: NAD and appears stated age Neurological: Alert and oriented Psych: Appropriate affect and cooperative There were no vitals taken for this visit.   Comprehensive Musculoskeletal Exam:    No tenderness palpation over the cervical spine or paraspinal muscles.  Cervical ROM is full with all motions, however he does have discomfort and increased radicular symptoms down the right arm with flexion and rotation to the right.  Negative Spurling's.  Tenderness over the anterior and lateral shoulder.  Active shoulder range of motion 270 degrees forward flexion, 30 degrees external rotation, and internal rotation to L4 bilaterally.  Negative Neer/Hawkins, empty can, O'Brien, and belly press test.  Grip strength 5/5 bilaterally.  Decreased sensation noted into first and second fingers of right hand.  Imaging:   Xray (cervical spine 4 views): Degenerative changes most notable at C5-C6 with anterior spurring.    I personally reviewed and interpreted the radiographs.   Assessment:   47 y.o. male with 2-1/2 months of atraumatic right shoulder pain.  He does have some radicular symptoms down the arm and into the fingers.  I do believe that his shoulder pain and radiating symptoms are consistent with cervical radiculopathy.  Symptoms do generally follow a C6 distribution and he does have some degenerative changes and decreased disk space at this level on today's radiographs.  Manual testing of the shoulder does not suggest rotator cuff tendinopathy.  At this point I do believe further imagine is indicated of the  cervical spine, however patient cannot undergo MRI due to a retained metal BB near his left eye socket.  For this reason, I will plan to order a CT myelogram and have him return to clinic shortly after to review.  Will send in Meloxicam that he can take in addition to Tylenol and Robaxin to use as needed.  Plan :    - Obtain CT myelogram of cervical spine and return to clinic for review - Start Meloxicam 15 mg daily and Robaxin as needed     I personally  saw and evaluated the patient, and participated in the management and treatment plan.  Hazle Nordmann, PA-C Orthopedics  This document was dictated using Conservation officer, historic buildings. A reasonable attempt at proof reading has been made to minimize errors.

## 2023-07-11 NOTE — Discharge Instructions (Signed)

## 2023-07-12 ENCOUNTER — Ambulatory Visit
Admission: RE | Admit: 2023-07-12 | Discharge: 2023-07-12 | Disposition: A | Discharge status: Home / Self Care | Payer: BC Managed Care – PPO | Source: Ambulatory Visit | Attending: Student | Admitting: Student

## 2023-07-12 DIAGNOSIS — M5412 Radiculopathy, cervical region: Secondary | ICD-10-CM

## 2023-07-12 MED ORDER — ONDANSETRON HCL 4 MG/2ML IJ SOLN: 4.0000 mg | Freq: Once | INTRAMUSCULAR | Status: DC | PRN

## 2023-07-12 MED ORDER — MEPERIDINE HCL 50 MG/ML IJ SOLN: 50.0000 mg | Freq: Once | INTRAMUSCULAR | Status: DC | PRN

## 2023-07-12 MED ORDER — DIAZEPAM 5 MG PO TABS
10.0000 mg | ORAL_TABLET | Freq: Once | ORAL | Status: AC
  Administered 2023-07-12: 10 mg via ORAL

## 2023-07-12 MED ORDER — IOPAMIDOL (ISOVUE-M 300) INJECTION 61%
10.0000 mL | Freq: Once | INTRAMUSCULAR | Status: AC
  Administered 2023-07-12: 10 mL via INTRATHECAL

## 2023-07-18 ENCOUNTER — Ambulatory Visit (INDEPENDENT_AMBULATORY_CARE_PROVIDER_SITE_OTHER): Payer: BC Managed Care – PPO | Admitting: Student

## 2023-07-18 ENCOUNTER — Encounter (HOSPITAL_BASED_OUTPATIENT_CLINIC_OR_DEPARTMENT_OTHER): Payer: Self-pay | Admitting: Student

## 2023-07-18 DIAGNOSIS — M5412 Radiculopathy, cervical region: Secondary | ICD-10-CM | POA: Diagnosis not present

## 2023-07-18 NOTE — Progress Notes (Signed)
Chief Complaint: Right shoulder and arm pain     History of Present Illness:   07/18/23: Patient returns to clinic today for reevaluation and review of CT myelogram patient states that overall he is feeling much better.  He still reports some occasional tingling going down the right arm, but does not travel all the way down to the hand.  States that this point he has having no pain.  He has been taking Robaxin 500 mg as needed as well as meloxicam 15 mg every 1 to 2 days.  He has also been utilizing with a cervical traction pillow at home which he states has brought him relief.    07/04/23: Gregory Preston is a 47 y.o. male presenting today for evaluation of right shoulder pain.  This began back in early June with no known injury and has been worsening since.  He has pain over the lateral and anterior shoulder that radiates down the arm toward the elbow.  He also has numbness and tingling over the lateral aspect of the forearm into the first and second fingers.  Denies any neck pain today, however certain motion such as cervical to rotation to the right exacerbate the paresthesias down his arm.  Pain is moderate at baseline and sometimes does become severe.  This is made worse with laying down and as result he has been having trouble sleeping.  He did see his chiropractor for 6 weeks which did help him regain some range of motion in his neck, however his pain and radicular symptoms have continued.  He has been taking ibuprofen relief with Tylenol with little relief.  He has previously had a Toradol injection and course of oral prednisone which did not help.  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   Surgical History:   None  PMH/PSH/Family History/Social History/Meds/Allergies:    Past Medical History:  Diagnosis Date   Allergy    Asthma    Eczema    Hand fracture, right 01/2012   Peptic ulcer disease 11/11   s/p NSAID use, resolved   Wears glasses    Past  Surgical History:  Procedure Laterality Date   BONE CYST EXCISION  10/11   mandible cyst excised   Social History   Socioeconomic History   Marital status: Married    Spouse name: Not on file   Number of children: Not on file   Years of education: Not on file   Highest education level: Not on file  Occupational History   Occupation: has a Aeronautical engineer business  Tobacco Use   Smoking status: Former    Current packs/day: 0.25    Average packs/day: 0.3 packs/day for 10.0 years (2.5 ttl pk-yrs)    Types: Cigars, Cigarettes   Smokeless tobacco: Never  Substance and Sexual Activity   Alcohol use: Yes    Alcohol/week: 2.0 standard drinks of alcohol    Types: 2 Standard drinks or equivalent per week    Comment: Rare   Drug use: Yes    Types: Marijuana   Sexual activity: Yes    Comment: married;  does landscaping; exercise through work, otherwise no  Other Topics Concern   Not on file  Social History Narrative   Married, exercise - landscaping at work; no children   Social Determinants of Health  Financial Resource Strain: Not on file  Food Insecurity: Not on file  Transportation Needs: Not on file  Physical Activity: Not on file  Stress: Not on file  Social Connections: Unknown (04/04/2022)   Received [facility]   Social Network    Social Network: Not on file   Family History  Problem Relation Age of Onset   Peripheral vascular disease Father    Heart disease Father        pacemaker   COPD Father    Heart disease Maternal Grandfather    Hypertension Neg Hx    Hyperlipidemia Neg Hx    Stroke Neg Hx    Diabetes Neg Hx    Cancer Neg Hx    No Known Allergies Current Outpatient Medications  Medication Sig Dispense Refill   acetaminophen (TYLENOL) 500 MG tablet Take 1,000 mg by mouth every 6 (six) hours as needed.     albuterol (VENTOLIN HFA) 108 (90 Base) MCG/ACT inhaler TAKE 2 PUFFS BY MOUTH EVERY 6 HOURS AS NEEDED FOR WHEEZE OR SHORTNESS OF BREATH 6.7 each  1   fluticasone furoate-vilanterol (BREO ELLIPTA) 200-25 MCG/ACT AEPB INHALE 1 PUFF BY MOUTH EVERY DAY 180 each 3   gabapentin (NEURONTIN) 100 MG capsule Take 1 capsule (100 mg total) by mouth 2 (two) times daily. 60 capsule 1   HYDROcodone-acetaminophen (NORCO) 5-325 MG tablet Take 1 tablet by mouth 2 (two) times daily as needed. 20 tablet 0   methocarbamol (ROBAXIN) 500 MG tablet Take 1 tablet (500 mg total) by mouth every 8 (eight) hours as needed for up to 14 days for muscle spasms. 42 tablet 0   Naproxen Sod-diphenhydrAMINE (ALEVE PM) 220-25 MG TABS Take 2 tablets by mouth at bedtime.     No current facility-administered medications for this visit.   No results found.  Review of Systems:   A ROS was performed including pertinent positives and negatives as documented in the HPI.  Physical Exam :   Constitutional: NAD and appears stated age Neurological: Alert and oriented Psych: Appropriate affect and cooperative There were no vitals taken for this visit.   Comprehensive Musculoskeletal Exam:    Cervical range of motion full without discomfort with flexion, extension, and rotation to both sides.  Negative Spurling's.  Full range of motion of bilateral shoulders to 160 degrees forward flexion and 40 degrees external rotation.  Grip strength 5/5 bilaterally.  Neurosensory exam intact.  Imaging:   CT myelogram (cervical spine): 1. Multilevel disc degeneration, worst at C5-6 where there is severe spinal stenosis and moderate to severe bilateral neural foraminal stenosis. 2. Mild-to-moderate spinal stenosis at C4-5 and C6-7   Assessment:   47 y.o. male with symptoms consistent with cervical radiculopathy.  At past visit 2 weeks ago, symptoms did present consistent with a C6 distribution pattern, and recent CT myelogram does suggest moderate to severe bilateral neural foraminal stenosis potentially impinging the C6 nerve root.  There is some other degenerative disc disease and spinal  stenosis present.  Patient's symptoms have improved dramatically over the past 2 weeks with use of meloxicam, Robaxin, and cervical traction.  For this reason, I would recommend continuing with current course and I did offer to provide 1 refill of each medication if symptoms begin to recur.  Will also consider formal referral to physical therapy as well as epidural steroid injection given the information from recent imaging.  He will plan to keep me updated on his progress and we will see him back as needed.  Plan :    -Return to clinic as needed    I personally saw and evaluated the patient, and participated in the management and treatment plan.  Hazle Nordmann, PA-C Orthopedics

## 2023-08-21 DIAGNOSIS — N201 Calculus of ureter: Secondary | ICD-10-CM

## 2023-08-21 HISTORY — DX: Calculus of ureter: N20.1

## 2023-09-01 ENCOUNTER — Inpatient Hospital Stay
Admission: EM | Admit: 2023-09-01 | Discharge: 2023-09-03 | DRG: 660 | Disposition: A | Discharge status: Home / Self Care | Payer: BC Managed Care – PPO | Attending: Internal Medicine | Admitting: Internal Medicine

## 2023-09-01 ENCOUNTER — Other Ambulatory Visit: Payer: Self-pay

## 2023-09-01 ENCOUNTER — Emergency Department: Payer: BC Managed Care – PPO

## 2023-09-01 DIAGNOSIS — S3710XA Unspecified injury of ureter, initial encounter: Secondary | ICD-10-CM | POA: Diagnosis present

## 2023-09-01 DIAGNOSIS — N132 Hydronephrosis with renal and ureteral calculous obstruction: Secondary | ICD-10-CM | POA: Diagnosis not present

## 2023-09-01 DIAGNOSIS — S3719XA Other injury of ureter, initial encounter: Secondary | ICD-10-CM | POA: Diagnosis present

## 2023-09-01 DIAGNOSIS — Z87891 Personal history of nicotine dependence: Secondary | ICD-10-CM

## 2023-09-01 DIAGNOSIS — Z7951 Long term (current) use of inhaled steroids: Secondary | ICD-10-CM

## 2023-09-01 DIAGNOSIS — G4733 Obstructive sleep apnea (adult) (pediatric): Secondary | ICD-10-CM | POA: Diagnosis present

## 2023-09-01 DIAGNOSIS — N200 Calculus of kidney: Secondary | ICD-10-CM

## 2023-09-01 DIAGNOSIS — Z791 Long term (current) use of non-steroidal anti-inflammatories (NSAID): Secondary | ICD-10-CM

## 2023-09-01 DIAGNOSIS — Z8249 Family history of ischemic heart disease and other diseases of the circulatory system: Secondary | ICD-10-CM

## 2023-09-01 DIAGNOSIS — N201 Calculus of ureter: Secondary | ICD-10-CM

## 2023-09-01 DIAGNOSIS — Z8711 Personal history of peptic ulcer disease: Secondary | ICD-10-CM

## 2023-09-01 DIAGNOSIS — R1010 Upper abdominal pain, unspecified: Principal | ICD-10-CM

## 2023-09-01 DIAGNOSIS — Z87442 Personal history of urinary calculi: Secondary | ICD-10-CM

## 2023-09-01 DIAGNOSIS — Z79899 Other long term (current) drug therapy: Secondary | ICD-10-CM

## 2023-09-01 DIAGNOSIS — R1013 Epigastric pain: Secondary | ICD-10-CM | POA: Diagnosis not present

## 2023-09-01 DIAGNOSIS — J45909 Unspecified asthma, uncomplicated: Secondary | ICD-10-CM | POA: Diagnosis present

## 2023-09-01 DIAGNOSIS — X58XXXA Exposure to other specified factors, initial encounter: Secondary | ICD-10-CM | POA: Diagnosis present

## 2023-09-01 DIAGNOSIS — M4802 Spinal stenosis, cervical region: Secondary | ICD-10-CM | POA: Diagnosis present

## 2023-09-01 DIAGNOSIS — D72829 Elevated white blood cell count, unspecified: Secondary | ICD-10-CM | POA: Diagnosis present

## 2023-09-01 DIAGNOSIS — Z6841 Body Mass Index (BMI) 40.0 and over, adult: Secondary | ICD-10-CM

## 2023-09-01 DIAGNOSIS — K760 Fatty (change of) liver, not elsewhere classified: Secondary | ICD-10-CM | POA: Diagnosis present

## 2023-09-01 LAB — DIFFERENTIAL
Abs Immature Granulocytes: 0.1 10*3/uL — ABNORMAL HIGH (ref 0.00–0.07)
Basophils Absolute: 0 10*3/uL (ref 0.0–0.1)
Basophils Relative: 0 %
Eosinophils Absolute: 0.2 10*3/uL (ref 0.0–0.5)
Eosinophils Relative: 1 %
Immature Granulocytes: 1 %
Lymphocytes Relative: 15 %
Lymphs Abs: 2.6 10*3/uL (ref 0.7–4.0)
Monocytes Absolute: 1.1 10*3/uL — ABNORMAL HIGH (ref 0.1–1.0)
Monocytes Relative: 6 %
Neutro Abs: 13.5 10*3/uL — ABNORMAL HIGH (ref 1.7–7.7)
Neutrophils Relative %: 77 %

## 2023-09-01 LAB — CBC
HCT: 44.3 % (ref 39.0–52.0)
Hemoglobin: 15.1 g/dL (ref 13.0–17.0)
MCH: 30.8 pg (ref 26.0–34.0)
MCHC: 34.1 g/dL (ref 30.0–36.0)
MCV: 90.2 fL (ref 80.0–100.0)
Platelets: 250 10*3/uL (ref 150–400)
RBC: 4.91 MIL/uL (ref 4.22–5.81)
RDW: 12.5 % (ref 11.5–15.5)
WBC: 17.8 10*3/uL — ABNORMAL HIGH (ref 4.0–10.5)
nRBC: 0 % (ref 0.0–0.2)

## 2023-09-01 LAB — COMPREHENSIVE METABOLIC PANEL
ALT: 43 U/L (ref 0–44)
AST: 37 U/L (ref 15–41)
Albumin: 4.5 g/dL (ref 3.5–5.0)
Alkaline Phosphatase: 64 U/L (ref 38–126)
Anion gap: 14 (ref 5–15)
BUN: 14 mg/dL (ref 6–20)
CO2: 20 mmol/L — ABNORMAL LOW (ref 22–32)
Calcium: 9.6 mg/dL (ref 8.9–10.3)
Chloride: 104 mmol/L (ref 98–111)
Creatinine, Ser: 1.17 mg/dL (ref 0.61–1.24)
GFR, Estimated: >60 mL/min (ref >60)
Glucose, Bld: 143 mg/dL — ABNORMAL HIGH (ref 70–99)
Potassium: 3.6 mmol/L (ref 3.5–5.1)
Sodium: 138 mmol/L (ref 135–145)
Total Bilirubin: 0.5 mg/dL (ref 0.3–1.2)
Total Protein: 7.8 g/dL (ref 6.5–8.1)

## 2023-09-01 LAB — LIPASE, BLOOD: Lipase: 48 U/L (ref 11–51)

## 2023-09-01 MED ORDER — HYDROMORPHONE HCL 1 MG/ML IJ SOLN
0.5000 mg | Freq: Once | INTRAMUSCULAR | Status: AC
  Administered 2023-09-01: 0.5 mg via INTRAVENOUS
  Filled 2023-09-01: qty 0.5

## 2023-09-01 MED ORDER — SODIUM CHLORIDE 0.9 % IV BOLUS
1000.0000 mL | Freq: Once | INTRAVENOUS | Status: AC
  Administered 2023-09-01: 1000 mL via INTRAVENOUS

## 2023-09-01 MED ORDER — ONDANSETRON HCL 4 MG/2ML IJ SOLN
4.0000 mg | Freq: Once | INTRAMUSCULAR | Status: AC
  Administered 2023-09-01: 4 mg via INTRAVENOUS
  Filled 2023-09-01: qty 2

## 2023-09-01 MED ORDER — ACETAMINOPHEN 325 MG PO TABS
650.0000 mg | ORAL_TABLET | Freq: Once | ORAL | Status: AC
  Administered 2023-09-01: 650 mg via ORAL
  Filled 2023-09-01: qty 2

## 2023-09-01 NOTE — ED Provider Notes (Signed)
[facility] Provider Note    Event Date/Time   First MD Initiated Contact with Patient 09/01/23 2305     (approximate)   History   Abdominal Pain   HPI  Gregory Preston is a 47 y.o. male who presents to the ED from home with a chief complaint of epigastric pain which occurred around 4:30 PM.  Describes constant, nonradiating pain associated with dry heaves.  Denies associated fever/chills, chest pain, shortness of breath, dysuria or diarrhea.     Past Medical History   Past Medical History:  Diagnosis Date   Allergy    Asthma    Eczema    Hand fracture, right 01/2012   Peptic ulcer disease 11/11   s/p NSAID use, resolved   Wears glasses      Active Problem List   Patient Active Problem List   Diagnosis Date Noted   Leukocytosis 11/09/2021   Dysfunction of eustachian tube 11/09/2021   OSA (obstructive sleep apnea) 06/11/2018   Asthma in adult 11/22/2011   Morbid obesity (HCC) 10/09/2011   Impaired fasting blood sugar 10/09/2011     Past Surgical History   Past Surgical History:  Procedure Laterality Date   BONE CYST EXCISION  10/11   mandible cyst excised     Home Medications   Prior to Admission medications   Medication Sig Start Date End Date Taking? Authorizing Provider  acetaminophen (TYLENOL) 500 MG tablet Take 1,000 mg by mouth every 6 (six) hours as needed.    [provider]  albuterol (VENTOLIN HFA) 108 (90 Base) MCG/ACT inhaler TAKE 2 PUFFS BY MOUTH EVERY 6 HOURS AS NEEDED FOR WHEEZE OR SHORTNESS OF BREATH 07/05/23   Ronnald Nian, MD  fluticasone furoate-vilanterol (BREO ELLIPTA) 200-25 MCG/ACT AEPB INHALE 1 PUFF BY MOUTH EVERY DAY 11/07/22   Ronnald Nian, MD  gabapentin (NEURONTIN) 100 MG capsule Take 1 capsule (100 mg total) by mouth 2 (two) times daily. 05/16/23   Tysinger, Kermit Balo, PA-C  HYDROcodone-acetaminophen (NORCO) 5-325 MG tablet Take 1 tablet by mouth 2 (two) times daily as needed. 05/16/23    Tysinger, Kermit Balo, PA-C  Naproxen Sod-diphenhydrAMINE (ALEVE PM) 220-25 MG TABS Take 2 tablets by mouth at bedtime.    [provider]     Allergies  Patient has no known allergies.   Family History   Family History  Problem Relation Age of Onset   Peripheral vascular disease Father    Heart disease Father        pacemaker   COPD Father    Heart disease Maternal Grandfather    Hypertension Neg Hx    Hyperlipidemia Neg Hx    Stroke Neg Hx    Diabetes Neg Hx    Cancer Neg Hx      Physical Exam  Triage Vital Signs: ED Triage Vitals  Encounter Vitals Group     BP 09/01/23 1946 (!) 158/98     Systolic BP Percentile --      Diastolic BP Percentile --      Pulse Rate 09/01/23 1946 88     Resp 09/01/23 1946 18     Temp 09/01/23 1946 97.9 F (36.6 C)     Temp Source 09/01/23 1946 Oral     SpO2 09/01/23 1946 100 %     Weight 09/01/23 1947 (!) 335 lb (152 kg)     Height 09/01/23 1947 5\' 11"  (1.803 m)     Head Circumference --  Peak Flow --      Pain Score 09/01/23 1947 10     Pain Loc --      Pain Education --      Exclude from Growth Chart --     Updated Vital Signs: BP (!) 155/84   Pulse 88   Temp 97.9 F (36.6 C) (Oral)   Resp 20   Ht 5\' 11"  (1.803 m)   Wt (!) 152 kg   SpO2 100%   BMI 46.72 kg/m    General: Awake, mild to moderate distress.  CV:  RRR.  Good peripheral perfusion.  Resp:  Normal effort.  CTAB. Abd:  Mild to moderate epigastric tenderness to palpation without rebound or guarding.  No distention.  Other:  No truncal vesicles.   ED Results / Procedures / Treatments  Labs (all labs ordered are listed, but only abnormal results are displayed) Labs Reviewed  COMPREHENSIVE METABOLIC PANEL - Abnormal; Notable for the following components:      Result Value   CO2 20 (*)    Glucose, Bld 143 (*)    All other components within normal limits  CBC - Abnormal; Notable for the following components:   WBC 17.8 (*)    All other  components within normal limits  DIFFERENTIAL - Abnormal; Notable for the following components:   Neutro Abs 13.5 (*)    Monocytes Absolute 1.1 (*)    Abs Immature Granulocytes 0.10 (*)    All other components within normal limits  LIPASE, BLOOD  URINALYSIS, ROUTINE W REFLEX MICROSCOPIC     EKG  None   RADIOLOGY I have independently visualized and interpreted patient's imaging study as well as noted the radiology interpretation:  Ultrasound: ***  Official radiology report(s): No results found.   PROCEDURES:  Critical Care performed: {CriticalCareYesNo:19197::"Yes, see critical care procedure note(s)","No"}  .1-3 Lead EKG Interpretation  Performed by: Irean Hong, MD Authorized by: Irean Hong, MD     Interpretation: normal     ECG rate:  85   ECG rate assessment: normal     Rhythm: sinus rhythm     Ectopy: none     Conduction: normal   Comments:     Patient placed on cardiac monitor to evaluate for arrhythmias    MEDICATIONS ORDERED IN ED: Medications  acetaminophen (TYLENOL) tablet 650 mg (650 mg Oral Given 09/01/23 1953)  sodium chloride 0.9 % bolus 1,000 mL (1,000 mLs Intravenous New Bag/Given 09/01/23 2313)  ondansetron (ZOFRAN) injection 4 mg (4 mg Intravenous Given 09/01/23 2313)  HYDROmorphone (DILAUDID) injection 0.5 mg (0.5 mg Intravenous Given 09/01/23 2313)     IMPRESSION / MDM / ASSESSMENT AND PLAN / ED COURSE  I reviewed the triage vital signs and the nursing notes.                             47 year old male presenting with epigastric abdominal pain.  Differential diagnosis includes, but is not limited to, biliary disease (biliary colic, acute cholecystitis, cholangitis, choledocholithiasis, etc), intrathoracic causes for epigastric abdominal pain including ACS, gastritis, duodenitis, pancreatitis, small bowel or large bowel obstruction, abdominal aortic aneurysm, hernia, and ulcer(s).  I personally reviewed patient's records and note an  orthopedic office visit for cervical radiculopathy on 07/18/2023.   Patient's presentation is most consistent with acute presentation with potential threat to life or bodily function.  The patient is on the cardiac monitor to evaluate for evidence of arrhythmia and/or significant  heart rate changes.   Laboratory results notable for leukocytosis with WBC 17, unremarkable LFTs/lipase.  Will obtain ultrasound, initiate IV fluid hydration, IV Dilaudid for pain and IV Zofran for nausea.  Will reassess.      FINAL CLINICAL IMPRESSION(S) / ED DIAGNOSES   Final diagnoses:  Pain of upper abdomen     Rx / DC Orders   ED Discharge Orders     None        Note:  This document was prepared using Dragon voice recognition software and may include unintentional dictation errors.

## 2023-09-01 NOTE — ED Triage Notes (Signed)
Pt to ed from home via POV for abd pain that was sudden onset. Pt is caox4, in no acute distress and ambulatory in triage. Pt states he laid down for a nap and then woke up in pain.

## 2023-09-02 ENCOUNTER — Encounter
Admission: EM | Disposition: A | Discharge status: Home / Self Care | Payer: Self-pay | Source: Home / Self Care | Attending: Internal Medicine

## 2023-09-02 ENCOUNTER — Inpatient Hospital Stay: Payer: BC Managed Care – PPO | Admitting: Anesthesiology

## 2023-09-02 ENCOUNTER — Emergency Department: Payer: BC Managed Care – PPO

## 2023-09-02 ENCOUNTER — Inpatient Hospital Stay: Payer: BC Managed Care – PPO

## 2023-09-02 DIAGNOSIS — Z7951 Long term (current) use of inhaled steroids: Secondary | ICD-10-CM | POA: Diagnosis not present

## 2023-09-02 DIAGNOSIS — N201 Calculus of ureter: Secondary | ICD-10-CM | POA: Diagnosis not present

## 2023-09-02 DIAGNOSIS — Z87442 Personal history of urinary calculi: Secondary | ICD-10-CM

## 2023-09-02 DIAGNOSIS — Z8249 Family history of ischemic heart disease and other diseases of the circulatory system: Secondary | ICD-10-CM | POA: Diagnosis not present

## 2023-09-02 DIAGNOSIS — M4802 Spinal stenosis, cervical region: Secondary | ICD-10-CM | POA: Diagnosis present

## 2023-09-02 DIAGNOSIS — Z6841 Body Mass Index (BMI) 40.0 and over, adult: Secondary | ICD-10-CM | POA: Diagnosis not present

## 2023-09-02 DIAGNOSIS — Z87891 Personal history of nicotine dependence: Secondary | ICD-10-CM | POA: Diagnosis not present

## 2023-09-02 DIAGNOSIS — N132 Hydronephrosis with renal and ureteral calculous obstruction: Secondary | ICD-10-CM | POA: Diagnosis present

## 2023-09-02 DIAGNOSIS — S3719XA Other injury of ureter, initial encounter: Secondary | ICD-10-CM | POA: Diagnosis present

## 2023-09-02 DIAGNOSIS — R1013 Epigastric pain: Secondary | ICD-10-CM | POA: Diagnosis present

## 2023-09-02 DIAGNOSIS — J45909 Unspecified asthma, uncomplicated: Secondary | ICD-10-CM | POA: Diagnosis present

## 2023-09-02 DIAGNOSIS — Z8711 Personal history of peptic ulcer disease: Secondary | ICD-10-CM | POA: Diagnosis not present

## 2023-09-02 DIAGNOSIS — Z79899 Other long term (current) drug therapy: Secondary | ICD-10-CM | POA: Diagnosis not present

## 2023-09-02 DIAGNOSIS — S3710XA Unspecified injury of ureter, initial encounter: Secondary | ICD-10-CM | POA: Diagnosis present

## 2023-09-02 DIAGNOSIS — Z791 Long term (current) use of non-steroidal anti-inflammatories (NSAID): Secondary | ICD-10-CM | POA: Diagnosis not present

## 2023-09-02 DIAGNOSIS — N200 Calculus of kidney: Secondary | ICD-10-CM

## 2023-09-02 DIAGNOSIS — X58XXXA Exposure to other specified factors, initial encounter: Secondary | ICD-10-CM | POA: Diagnosis present

## 2023-09-02 DIAGNOSIS — K760 Fatty (change of) liver, not elsewhere classified: Secondary | ICD-10-CM | POA: Diagnosis present

## 2023-09-02 DIAGNOSIS — D72829 Elevated white blood cell count, unspecified: Secondary | ICD-10-CM | POA: Diagnosis present

## 2023-09-02 DIAGNOSIS — G4733 Obstructive sleep apnea (adult) (pediatric): Secondary | ICD-10-CM | POA: Diagnosis present

## 2023-09-02 HISTORY — PX: CYSTOSCOPY W/ URETERAL STENT PLACEMENT: SHX1429

## 2023-09-02 LAB — URINALYSIS, ROUTINE W REFLEX MICROSCOPIC
Bacteria, UA: NONE SEEN
Bilirubin Urine: NEGATIVE
Glucose, UA: NEGATIVE mg/dL
Ketones, ur: NEGATIVE mg/dL
Leukocytes,Ua: NEGATIVE
Nitrite: NEGATIVE
Protein, ur: NEGATIVE mg/dL
Specific Gravity, Urine: 1.027 (ref 1.005–1.030)
Squamous Epithelial / HPF: 0 /[HPF] (ref 0–5)
pH: 5 (ref 5.0–8.0)

## 2023-09-02 SURGERY — CYSTOSCOPY, FLEXIBLE, WITH STENT REPLACEMENT
Anesthesia: General | Site: Ureter | Laterality: Right

## 2023-09-02 MED ORDER — ALBUTEROL SULFATE (2.5 MG/3ML) 0.083% IN NEBU: 2.5000 mg | INHALATION_SOLUTION | Freq: Four times a day (QID) | RESPIRATORY_TRACT | Status: DC | PRN

## 2023-09-02 MED ORDER — DEXAMETHASONE SODIUM PHOSPHATE 10 MG/ML IJ SOLN
INTRAMUSCULAR | Status: DC | PRN
  Administered 2023-09-02: 10 mg via INTRAVENOUS

## 2023-09-02 MED ORDER — PIPERACILLIN-TAZOBACTAM 3.375 G IVPB
INTRAVENOUS | Status: AC
  Filled 2023-09-02: qty 50

## 2023-09-02 MED ORDER — ROCURONIUM BROMIDE 100 MG/10ML IV SOLN
INTRAVENOUS | Status: DC | PRN
  Administered 2023-09-02: 20 mg via INTRAVENOUS

## 2023-09-02 MED ORDER — IOHEXOL 180 MG/ML  SOLN
INTRAMUSCULAR | Status: DC | PRN
  Administered 2023-09-02: 7 mL

## 2023-09-02 MED ORDER — STERILE WATER FOR IRRIGATION IR SOLN
Status: DC | PRN
Start: 2023-09-02 — End: 2023-09-02
  Administered 2023-09-02: 500 mL

## 2023-09-02 MED ORDER — MIDAZOLAM HCL 2 MG/2ML IJ SOLN
INTRAMUSCULAR | Status: AC
  Filled 2023-09-02: qty 2

## 2023-09-02 MED ORDER — OXYCODONE HCL 5 MG/5ML PO SOLN: 5.0000 mg | Freq: Once | ORAL | Status: AC | PRN

## 2023-09-02 MED ORDER — SUCCINYLCHOLINE CHLORIDE 200 MG/10ML IV SOSY
PREFILLED_SYRINGE | INTRAVENOUS | Status: DC | PRN
  Administered 2023-09-02: 180 mg via INTRAVENOUS

## 2023-09-02 MED ORDER — OXYCODONE HCL 5 MG PO TABS
5.0000 mg | ORAL_TABLET | Freq: Once | ORAL | Status: AC | PRN
  Administered 2023-09-02: 5 mg via ORAL

## 2023-09-02 MED ORDER — ACETAMINOPHEN 325 MG PO TABS: 650.0000 mg | ORAL_TABLET | Freq: Four times a day (QID) | ORAL | Status: DC | PRN

## 2023-09-02 MED ORDER — PIPERACILLIN-TAZOBACTAM 3.375 G IVPB
3.3750 g | Freq: Three times a day (TID) | INTRAVENOUS | Status: DC
  Administered 2023-09-02 – 2023-09-03 (×2): 3.375 g via INTRAVENOUS
  Filled 2023-09-02 (×2): qty 50

## 2023-09-02 MED ORDER — OXYCODONE HCL 5 MG PO TABS
ORAL_TABLET | ORAL | Status: AC
  Filled 2023-09-02: qty 1

## 2023-09-02 MED ORDER — SUGAMMADEX SODIUM 500 MG/5ML IV SOLN
INTRAVENOUS | Status: DC | PRN
  Administered 2023-09-02: 200 mg via INTRAVENOUS

## 2023-09-02 MED ORDER — IPRATROPIUM-ALBUTEROL 0.5-2.5 (3) MG/3ML IN SOLN
3.0000 mL | RESPIRATORY_TRACT | Status: DC
  Administered 2023-09-02: 3 mL via RESPIRATORY_TRACT

## 2023-09-02 MED ORDER — ONDANSETRON HCL 4 MG/2ML IJ SOLN: 4.0000 mg | Freq: Once | INTRAMUSCULAR | Status: DC | PRN

## 2023-09-02 MED ORDER — ACETAMINOPHEN 10 MG/ML IV SOLN: 1000.0000 mg | Freq: Once | INTRAVENOUS | Status: DC | PRN

## 2023-09-02 MED ORDER — ONDANSETRON HCL 4 MG PO TABS: 4.0000 mg | ORAL_TABLET | Freq: Four times a day (QID) | ORAL | Status: DC | PRN

## 2023-09-02 MED ORDER — DEXTROSE 5 % IV SOLN
INTRAVENOUS | Status: DC | PRN
  Administered 2023-09-02: 3 g via INTRAVENOUS

## 2023-09-02 MED ORDER — ACETAMINOPHEN 650 MG RE SUPP: 650.0000 mg | Freq: Four times a day (QID) | RECTAL | Status: DC | PRN

## 2023-09-02 MED ORDER — HYDROMORPHONE HCL 1 MG/ML IJ SOLN
0.5000 mg | Freq: Once | INTRAMUSCULAR | Status: AC
  Administered 2023-09-02: 0.5 mg via INTRAVENOUS
  Filled 2023-09-02: qty 0.5

## 2023-09-02 MED ORDER — HYDROCODONE-ACETAMINOPHEN 5-325 MG PO TABS: 1.0000 | ORAL_TABLET | ORAL | Status: DC | PRN

## 2023-09-02 MED ORDER — LIDOCAINE HCL (CARDIAC) PF 100 MG/5ML IV SOSY
PREFILLED_SYRINGE | INTRAVENOUS | Status: DC | PRN
  Administered 2023-09-02: 100 mg via INTRAVENOUS

## 2023-09-02 MED ORDER — MIDAZOLAM HCL 2 MG/2ML IJ SOLN
INTRAMUSCULAR | Status: DC | PRN
  Administered 2023-09-02: 2 mg via INTRAVENOUS

## 2023-09-02 MED ORDER — IPRATROPIUM-ALBUTEROL 0.5-2.5 (3) MG/3ML IN SOLN
RESPIRATORY_TRACT | Status: AC
  Filled 2023-09-02: qty 3

## 2023-09-02 MED ORDER — FENTANYL CITRATE (PF) 100 MCG/2ML IJ SOLN: 25.0000 ug | INTRAMUSCULAR | Status: DC | PRN

## 2023-09-02 MED ORDER — FENTANYL CITRATE (PF) 100 MCG/2ML IJ SOLN
INTRAMUSCULAR | Status: DC | PRN
  Administered 2023-09-02: 100 ug via INTRAVENOUS

## 2023-09-02 MED ORDER — DEXMEDETOMIDINE HCL IN NACL 80 MCG/20ML IV SOLN
INTRAVENOUS | Status: DC | PRN
Start: 2023-09-02 — End: 2023-09-02
  Administered 2023-09-02: 8 ug via INTRAVENOUS

## 2023-09-02 MED ORDER — IOHEXOL 350 MG/ML SOLN
100.0000 mL | Freq: Once | INTRAVENOUS | Status: AC | PRN
  Administered 2023-09-02: 100 mL via INTRAVENOUS

## 2023-09-02 MED ORDER — PIPERACILLIN-TAZOBACTAM 3.375 G IVPB 30 MIN
3.3750 g | Freq: Once | INTRAVENOUS | Status: AC
  Administered 2023-09-02: 3.375 g via INTRAVENOUS
  Filled 2023-09-02: qty 50

## 2023-09-02 MED ORDER — SODIUM CHLORIDE 0.9 % IR SOLN
Status: DC | PRN
  Administered 2023-09-02: 1000 mL via INTRAVESICAL

## 2023-09-02 MED ORDER — ALBUTEROL SULFATE (2.5 MG/3ML) 0.083% IN NEBU: 2.5000 mg | INHALATION_SOLUTION | Freq: Four times a day (QID) | RESPIRATORY_TRACT | Status: DC

## 2023-09-02 MED ORDER — ONDANSETRON HCL 4 MG/2ML IJ SOLN
INTRAMUSCULAR | Status: DC | PRN
  Administered 2023-09-02: 4 mg via INTRAVENOUS

## 2023-09-02 MED ORDER — ALBUTEROL SULFATE (2.5 MG/3ML) 0.083% IN NEBU: 3.0000 mL | INHALATION_SOLUTION | RESPIRATORY_TRACT | Status: DC | PRN

## 2023-09-02 MED ORDER — FENTANYL CITRATE (PF) 100 MCG/2ML IJ SOLN
INTRAMUSCULAR | Status: AC
  Filled 2023-09-02: qty 2

## 2023-09-02 MED ORDER — HYDROMORPHONE HCL 1 MG/ML IJ SOLN
1.0000 mg | INTRAMUSCULAR | Status: DC | PRN
  Administered 2023-09-02: 1 mg via INTRAVENOUS
  Filled 2023-09-02: qty 1

## 2023-09-02 MED ORDER — ONDANSETRON HCL 4 MG/2ML IJ SOLN: 4.0000 mg | Freq: Four times a day (QID) | INTRAMUSCULAR | Status: DC | PRN

## 2023-09-02 MED ORDER — PROPOFOL 10 MG/ML IV BOLUS
INTRAVENOUS | Status: DC | PRN
  Administered 2023-09-02: 200 mg via INTRAVENOUS

## 2023-09-02 SURGICAL SUPPLY — 19 items
BAG DRAIN SIEMENS DORNER NS (MISCELLANEOUS) ×1 IMPLANT
BAG DRN NS LF (MISCELLANEOUS) ×1
BRUSH SCRUB EZ 1% IODOPHOR (MISCELLANEOUS) ×1 IMPLANT
CATH URETL OPEN 5X70 (CATHETERS) ×1 IMPLANT
GLOVE BIO SURGEON STRL SZ 6.5 (GLOVE) ×1 IMPLANT
GOWN STRL REUS W/ TWL LRG LVL3 (GOWN DISPOSABLE) ×2 IMPLANT
GOWN STRL REUS W/TWL LRG LVL3 (GOWN DISPOSABLE) ×2
GUIDEWIRE STR DUAL SENSOR (WIRE) ×1 IMPLANT
IV NS IRRIG 3000ML ARTHROMATIC (IV SOLUTION) ×1 IMPLANT
KIT TURNOVER CYSTO (KITS) ×1 IMPLANT
PACK CYSTO AR (MISCELLANEOUS) ×1 IMPLANT
SET CYSTO W/LG BORE CLAMP LF (SET/KITS/TRAYS/PACK) ×1 IMPLANT
STENT URET 6FRX24 CONTOUR (STENTS) IMPLANT
STENT URET 6FRX26 CONTOUR (STENTS) IMPLANT
SURGILUBE 2OZ TUBE FLIPTOP (MISCELLANEOUS) ×1 IMPLANT
SYR TOOMEY IRRIG 70ML (MISCELLANEOUS) ×1
SYRINGE TOOMEY IRRIG 70ML (MISCELLANEOUS) ×1 IMPLANT
WATER STERILE IRR 1000ML POUR (IV SOLUTION) ×1 IMPLANT
WATER STERILE IRR 500ML POUR (IV SOLUTION) ×1 IMPLANT

## 2023-09-02 NOTE — Anesthesia Preprocedure Evaluation (Signed)
Anesthesia Evaluation  Patient identified by MRN, date of birth, ID band Patient awake  General Assessment Comment:  Never had anesthesia before. Vomited yesterday from the pain.  Reviewed: Allergy & Precautions, NPO status , Patient's Chart, lab work & pertinent test results  History of Anesthesia Complications Negative for: history of anesthetic complications  Airway Mallampati: II  TM Distance: >3 FB Neck ROM: Full    Dental no notable dental hx. (+) Teeth Intact   Pulmonary asthma , neg sleep apnea, neg COPD, Patient abstained from smoking.Not current smoker, former smoker Takes daily Breo, and roughly daily albuterol. Has not taken Breo in a few days, last taken albuterol yesterday   Pulmonary exam normal breath sounds clear to auscultation       Cardiovascular Exercise Tolerance: Good METS(-) hypertension(-) CAD and (-) Past MI negative cardio ROS (-) dysrhythmias  Rhythm:Regular Rate:Normal - Systolic murmurs    Neuro/Psych negative neurological ROS  negative psych ROS   GI/Hepatic PUD,neg GERD  ,,(+)     (-) substance abuse    Endo/Other  neg diabetes  Morbid obesity  Renal/GU Renal disease     Musculoskeletal   Abdominal  (+) + obese  Peds  Hematology   Anesthesia Other Findings Past Medical History: No date: Allergy No date: Asthma No date: Eczema 01/2012: Hand fracture, right 11/11: Peptic ulcer disease     Comment:  s/p NSAID use, resolved No date: Wears glasses  Reproductive/Obstetrics                             Anesthesia Physical Anesthesia Plan  ASA: 3  Anesthesia Plan: General   Post-op Pain Management: Ofirmev IV (intra-op)*   Induction: Intravenous and Rapid sequence  PONV Risk Score and Plan: 3 and Ondansetron, Dexamethasone and Midazolam  Airway Management Planned: Oral ETT and Video Laryngoscope Planned  Additional Equipment: None  Intra-op Plan:    Post-operative Plan: Extubation in OR  Informed Consent: I have reviewed the patients History and Physical, chart, labs and discussed the procedure including the risks, benefits and alternatives for the proposed anesthesia with the patient or authorized representative who has indicated his/her understanding and acceptance.     Dental advisory given  Plan Discussed with: CRNA and Surgeon  Anesthesia Plan Comments: (Discussed risks of anesthesia with patient, including PONV, sore throat, lip/dental/eye damage. Rare risks discussed as well, such as cardiorespiratory and neurological sequelae, and allergic reactions. Discussed the role of CRNA in patient's perioperative care. Patient understands. Patient informed about increased incidence of above perioperative risk due to high BMI. Patient understands.  )       Anesthesia Quick Evaluation

## 2023-09-02 NOTE — Assessment & Plan Note (Signed)
No acute issues Followed by Ortho

## 2023-09-02 NOTE — H&P (Signed)
History and Physical    Patient: Gregory Preston BJY:[id number] DOB: 06-Nov-1976 DOA: 09/01/2023 DOS: the patient was seen and examined on 09/02/2023 PCP: Ronnald Nian, MD  Patient coming from: Home  Chief Complaint:  Chief Complaint  Patient presents with   Abdominal Pain    HPI: Gregory Preston is a 47 y.o. male with medical history significant for Asthma, BMI over 40, OSA, cervical spinal stenosis, who presented to the ED with sudden onset epigastric pain that awoke him from sleep.  He denied nausea, vomiting, fever or chills and denies diarrhea or dysuria.  Denies cough, chest pain or shortness of breath.  He was previously in his usual state of health. ED course and data review: BP 158/98 with otherwise normal vitals Labs notable for WBC 17,800.  Lipase and LFTs WNL.  Urinalysis shows moderate hemoglobin but without infection. Right upper quadrant ultrasound showed hepatic steatosis without focal liver lesions CT abdomen and pelvis showing an obstructing 2 mm UVJ calculus with a lot of stranding. Follow-up contrasted study recommended by radiologist showed extensive extravasation of contrast in the periureteral area suggestive of ureteral rupture as follows: ADDENDUM: Delayed CT images are remarkable for the presence of an extensive amount of extravasated peripelvic, periureteral and retroperitoneal contrast. This extends into the lower abdomen and pelvis and is consistent with proximal ureteral rupture.  The ED provider spoke with urologist, Dr. Apolinar Junes who reviewed images and would like to stent patient in the a.m. Hospitalist consulted for admission   Review of Systems: As mentioned in the history of present illness. All other systems reviewed and are negative.  Past Medical History:  Diagnosis Date   Allergy    Asthma    Eczema    Hand fracture, right 01/2012   Peptic ulcer disease 11/11   s/p NSAID use, resolved   Wears glasses    Past Surgical History:   Procedure Laterality Date   BONE CYST EXCISION  10/11   mandible cyst excised   Social History:  reports that he has quit smoking. His smoking use included cigars and cigarettes. He has a 2.5 pack-year smoking history. He has never used smokeless tobacco. He reports current alcohol use of about 2.0 standard drinks of alcohol per week. He reports current drug use. Drug: Marijuana.  No Known Allergies  Family History  Problem Relation Age of Onset   Peripheral vascular disease Father    Heart disease Father        pacemaker   COPD Father    Heart disease Maternal Grandfather    Hypertension Neg Hx    Hyperlipidemia Neg Hx    Stroke Neg Hx    Diabetes Neg Hx    Cancer Neg Hx     Prior to Admission medications   Medication Sig Start Date End Date Taking? Authorizing Provider  acetaminophen (TYLENOL) 500 MG tablet Take 1,000 mg by mouth every 6 (six) hours as needed.    [provider]  albuterol (VENTOLIN HFA) 108 (90 Base) MCG/ACT inhaler TAKE 2 PUFFS BY MOUTH EVERY 6 HOURS AS NEEDED FOR WHEEZE OR SHORTNESS OF BREATH 07/05/23   Ronnald Nian, MD  fluticasone furoate-vilanterol (BREO ELLIPTA) 200-25 MCG/ACT AEPB INHALE 1 PUFF BY MOUTH EVERY DAY 11/07/22   Ronnald Nian, MD  gabapentin (NEURONTIN) 100 MG capsule Take 1 capsule (100 mg total) by mouth 2 (two) times daily. 05/16/23   Tysinger, Kermit Balo, PA-C  HYDROcodone-acetaminophen (NORCO) 5-325 MG tablet Take 1 tablet by mouth 2 (two) times  daily as needed. 05/16/23   Tysinger, Kermit Balo, PA-C  Naproxen Sod-diphenhydrAMINE (ALEVE PM) 220-25 MG TABS Take 2 tablets by mouth at bedtime.    [provider]    Physical Exam: Vitals:   09/02/23 0130 09/02/23 0200 09/02/23 0230 09/02/23 0300  BP: (!) 154/81 (!) 141/82 (!) 152/78 (!) 150/91  Pulse: 84 86 85 95  Resp: 18 18 16 16   Temp:      TempSrc:      SpO2: 100% 100% 100% 100%  Weight:      Height:       Physical Exam Vitals and nursing note reviewed.   Constitutional:      General: He is not in acute distress.    Appearance: He is morbidly obese.  HENT:     Head: Normocephalic and atraumatic.  Cardiovascular:     Rate and Rhythm: Normal rate and regular rhythm.     Heart sounds: Normal heart sounds.  Pulmonary:     Effort: Pulmonary effort is normal.     Breath sounds: Normal breath sounds.  Abdominal:     Palpations: Abdomen is soft.     Tenderness: There is no abdominal tenderness.  Neurological:     Mental Status: Mental status is at baseline.     Labs on Admission: I have personally reviewed following labs and imaging studies  CBC: Recent Labs  Lab 09/01/23 1949  WBC 17.8*  NEUTROABS 13.5*  HGB 15.1  HCT 44.3  MCV 90.2  PLT 250   Basic Metabolic Panel: Recent Labs  Lab 09/01/23 1949  NA 138  K 3.6  CL 104  CO2 20*  GLUCOSE 143*  BUN 14  CREATININE 1.17  CALCIUM 9.6   GFR: Estimated Creatinine Clearance: 114.5 mL/min (by C-G formula based on SCr of 1.17 mg/dL). Liver Function Tests: Recent Labs  Lab 09/01/23 1949  AST 37  ALT 43  ALKPHOS 64  BILITOT 0.5  PROT 7.8  ALBUMIN 4.5   Recent Labs  Lab 09/01/23 1949  LIPASE 48   No results for input(s): "AMMONIA" in the last 168 hours. Coagulation Profile: No results for input(s): "INR", "PROTIME" in the last 168 hours. Cardiac Enzymes: No results for input(s): "CKTOTAL", "CKMB", "CKMBINDEX", "TROPONINI" in the last 168 hours. BNP (last 3 results) No results for input(s): "PROBNP" in the last 8760 hours. HbA1C: No results for input(s): "HGBA1C" in the last 72 hours. CBG: No results for input(s): "GLUCAP" in the last 168 hours. Lipid Profile: No results for input(s): "CHOL", "HDL", "LDLCALC", "TRIG", "CHOLHDL", "LDLDIRECT" in the last 72 hours. Thyroid Function Tests: No results for input(s): "TSH", "T4TOTAL", "FREET4", "T3FREE", "THYROIDAB" in the last 72 hours. Anemia Panel: No results for input(s): "VITAMINB12", "FOLATE", "FERRITIN",  "TIBC", "IRON", "RETICCTPCT" in the last 72 hours. Urine analysis:    Component Value Date/Time   COLORURINE YELLOW (A) 09/01/2023 2300   APPEARANCEUR HAZY (A) 09/01/2023 2300   LABSPEC 1.027 09/01/2023 2300   PHURINE 5.0 09/01/2023 2300   GLUCOSEU NEGATIVE 09/01/2023 2300   HGBUR MODERATE (A) 09/01/2023 2300   BILIRUBINUR NEGATIVE 09/01/2023 2300   BILIRUBINUR n 08/23/2012 0830   KETONESUR NEGATIVE 09/01/2023 2300   PROTEINUR NEGATIVE 09/01/2023 2300   UROBILINOGEN negative 08/23/2012 0830   UROBILINOGEN 0.2 10/16/2010 0925   NITRITE NEGATIVE 09/01/2023 2300   LEUKOCYTESUR NEGATIVE 09/01/2023 2300    Radiological Exams on Admission: CT ABDOMEN PELVIS W CONTRAST  Addendum Date: 09/02/2023   ADDENDUM REPORT: 09/02/2023 02:37 ADDENDUM: Delayed CT images are remarkable  for the presence of an extensive amount of extravasated peripelvic, periureteral and retroperitoneal contrast. This extends into the lower abdomen and pelvis and is consistent with proximal ureteral rupture. Results were discussed with Dr. Megan Salon at 2:35 a.m. [place] on September 02, 2023. Electronically Signed   By: Aram Candela M.D.   On: 09/02/2023 02:37   Result Date: 09/02/2023 CLINICAL DATA:  Abdominal pain. EXAM: CT ABDOMEN AND PELVIS WITH CONTRAST TECHNIQUE: Multidetector CT imaging of the abdomen and pelvis was performed using the standard protocol following bolus administration of intravenous contrast. RADIATION DOSE REDUCTION: This exam was performed according to the departmental dose-optimization program which includes automated exposure control, adjustment of the mA and/or kV according to patient size and/or use of iterative reconstruction technique. CONTRAST:  OMNIPAQUE IOHEXOL 350 MG/ML SOLN COMPARISON:  None Available. FINDINGS: Lower chest: No acute abnormality. Hepatobiliary: There is diffuse fatty infiltration of the liver parenchyma. No focal liver abnormality is seen. No gallstones, gallbladder wall  thickening, or biliary dilatation. Pancreas: Unremarkable. No pancreatic ductal dilatation or surrounding inflammatory changes. Spleen: Normal in size without focal abnormality. Adrenals/Urinary Tract: Adrenal glands are unremarkable. Kidneys are normal in size, without focal lesions. A 2 mm calcification is seen within the urinary bladder at the right UVJ. Mild right-sided hydronephrosis and proximal hydroureter are present. Moderate severity right-sided peripelvic and periureteral inflammatory fat stranding is also noted. This extends along the anterior aspect of the right psoas muscle into the lower abdomen and pelvis (approximately 2.10 Hounsfield units). The urinary bladder is otherwise unremarkable. Stomach/Bowel: Stomach is within normal limits. Appendix appears normal. No evidence of bowel wall thickening, distention, or inflammatory changes. Vascular/Lymphatic: Aortic atherosclerosis. No enlarged abdominal or pelvic lymph nodes. Reproductive: Prostate is unremarkable. Other: No abdominal wall hernia or abnormality. No abdominopelvic ascites. Musculoskeletal: No acute or significant osseous findings. IMPRESSION: 1. Obstructing 2 mm calculus at the right UVJ. 2. Right-sided peripelvic, right periureteral and retroperitoneal inflammatory fat stranding, as described above. This may represent sequelae related to the previously noted obstructing ureteral calculus, however, its severity is greater than expected when compared to the degree of hydronephrosis and hydroureter. Delayed imaging of the current study is recommended to exclude ureteral injury. 3. Hepatic steatosis. 4. Aortic atherosclerosis. Aortic Atherosclerosis (ICD10-I70.0). Electronically Signed: By: Aram Candela M.D. On: 09/02/2023 02:11   US Abdomen Limited RUQ (LIVER/GB)  Result Date: 09/01/2023 CLINICAL DATA:  Right upper quadrant pain x1 day. EXAM: ULTRASOUND ABDOMEN LIMITED RIGHT UPPER QUADRANT COMPARISON:  None Available. FINDINGS:  Gallbladder: No gallstones or wall thickening visualized (1.8 mm). No sonographic Murphy sign noted by sonographer. Common bile duct: Diameter: 5.3 mm Liver: No focal lesion identified. The liver parenchyma is nodular in contour and diffusely increased in echogenicity. Portal vein is patent on color Doppler imaging with normal direction of blood flow towards the liver. Other: None. IMPRESSION: Hepatic steatosis without focal liver lesions. Electronically Signed   By: Aram Candela M.D.   On: 09/01/2023 23:46     Data Reviewed: Relevant notes from primary care and specialist visits, past discharge summaries as available in EHR, including Care Everywhere. Prior diagnostic testing as pertinent to current admission diagnoses Updated medications and problem lists for reconciliation ED course, including vitals, labs, imaging, treatment and response to treatment Triage notes, nursing and pharmacy notes and ED provider's notes Notable results as noted in HPI   Assessment and Plan: * Right ureteral rupture secondary to obstructing 2mm UVJ calculus N.p.o. for stent placement Urologist aware and formally consulted Pain  control Prophylactic Zosyn  Cervical spinal stenosis No acute issues Followed by Ortho  Asthma in adult Albuterol prn   Obesity, Class III, BMI 40-49.9 (morbid obesity) (HCC) Complicating factor to overall prognosis and care    DVT prophylaxis: SCD  Consults: Urology, Dr. Apolinar Junes  Advance Care Planning: full code  Family Communication: wife at bedside  Disposition Plan: Back to previous home environment  Severity of Illness: The appropriate patient status for this patient is INPATIENT. Inpatient status is judged to be reasonable and necessary in order to provide the required intensity of service to ensure the patient's safety. The patient's presenting symptoms, physical exam findings, and initial radiographic and laboratory data in the context of their chronic  comorbidities is felt to place them at high risk for further clinical deterioration. Furthermore, it is not anticipated that the patient will be medically stable for discharge from the hospital within 2 midnights of admission.   * I certify that at the point of admission it is my clinical judgment that the patient will require inpatient hospital care spanning beyond 2 midnights from the point of admission due to high intensity of service, high risk for further deterioration and high frequency of surveillance required.*  Author: Andris Baumann, MD 09/02/2023 3:45 AM  For on call review [email or web address].

## 2023-09-02 NOTE — Op Note (Signed)
Date of procedure: 09/02/23  Preoperative diagnosis:  Right UVJ stone Urinary extravasation/proximal ureteral injury  Postoperative diagnosis:  Same as above  Procedure: Cystoscopy Right retrograde pyelogram Right ureteral stent placement  Surgeon: [name], MD  Anesthesia: General  Complications: None  Intraoperative findings: Tight meatus requiring calibration.  Stone seen just within right UO.  Retrograde pyelogram initially with some mid ureteral dilation otherwise unremarkable.  Stent placed without difficulty.  On delayed, there is a small amount of what appears to be very mild/minimal extravasation around the proximal ureter/UVJ.  EBL: Minimal  Specimens: None  Drains: 6 x 26 French double-J ureteral stent on right  Indication: Gregory Preston is a 47 y.o. patient with right UVJ stone and proximal ureteral rupture/, urine extravasation secondary to this spontaneously..  After reviewing the management options for treatment, he elected to proceed with the above surgical procedure(s). We have discussed the potential benefits and risks of the procedure, side effects of the proposed treatment, the likelihood of the patient achieving the goals of the procedure, and any potential problems that might occur during the procedure or recuperation. Informed consent has been obtained.  Description of procedure:  The patient was taken to the operating room and general anesthesia was induced.  The patient was placed in the dorsal lithotomy position, prepped and draped in the usual sterile fashion, and preoperative antibiotics were administered. A preoperative time-out was performed.   I attempted placed a 60 French cystoscope however he had a fairly narrow urethral meatus.  Used male sounds up to 20 [place] to dilate his urethra.  The 29 French scope was then advanced per urethra into the bladder.  Attention was turned to the right UO.  Just within the orifice itself you could see small  stone but this was not readily accessible.  I cannulated the right ureteral orifice using a 5 [place] open-ended ureteral catheter which slid alongside the stone easily.  A gentle retrograde pyelogram on the side showed actually fairly decompressed ureter and collecting system with some mild mid ureteral dilation.  Initially, there is no extravasation seen.  A wire was then placed up to the level of an upper pole calyx.  As a continue to fluoroscopy start the procedure, I did see some very mild to minimal contrast extravasation near the proximal ureter renal pelvis.  A 6 x 26 French double-J ureteral stent was then advanced over the wire up to the level of the kidney.  Once the wire was removed, a full coil was noted in an upper pole calyx as well as a full coil within the bladder.  The bladder was then drained.  The patient was then cleaned and dried, repositioned in supine position, reversed of anesthesia, taken to the PACU in stable condition.  Notably, there was still a slight amount of blood at the meatus consistent with urethral calibration.  There is no significant active bleeding noted.  Plan: Patient did receive Ancef perioperatively.  Concern for infection is fairly low.  He is admitted to the hospitalist service for observation and likely can be discharged tomorrow if his hemodynamics remained stable as well as labs remained stable.  Will plan for ureteroscopic intervention of the stone in 4 to 6 weeks with retrograde pyelogram.  Will consider stent removal at that point in time as well as there is no urinary extravasation.  He is agreeable this plan.  [name], M.D.

## 2023-09-02 NOTE — Progress Notes (Signed)
Pt sleeping in PACU, VSS denies pain at this time. Awaiting bed assignment. Family in waiting room, Dr Apolinar Junes spoke to family after surgery.

## 2023-09-02 NOTE — Assessment & Plan Note (Addendum)
Albuterol prn

## 2023-09-02 NOTE — Progress Notes (Signed)
Pt is very agitated and frustrated d/t delayed bed assignment. Pt stated that he would possibly sign himself out. Explained the reasoning for the delay but did not want to wait anymore. Family updated throughout the day.

## 2023-09-02 NOTE — Transfer of Care (Signed)
Immediate Anesthesia Transfer of Care Note  Patient: Gregory Preston  Procedure(s) Performed: CYSTOSCOPY WITH STENT REPLACEMENT (Right: Ureter)  Patient Location: PACU  Anesthesia Type:General  Level of Consciousness: awake, alert , and oriented  Airway & Oxygen Therapy: Patient Spontanous Breathing  Post-op Assessment: Report given to RN and Post -op Vital signs reviewed and stable  Post vital signs: Reviewed and stable  Last Vitals:  Vitals Value Taken Time  BP 150/118 09/02/23 1100  Temp 36.9 C 09/02/23 1100  Pulse 100 09/02/23 1100  Resp 14 09/02/23 1100  SpO2 100 % 09/02/23 1100  Vitals shown include unfiled device data.  Last Pain:  Vitals:   09/02/23 0816  TempSrc:   PainSc: 4          Complications: No notable events documented.

## 2023-09-02 NOTE — Assessment & Plan Note (Signed)
Complicating factor to overall prognosis and care

## 2023-09-02 NOTE — H&P (View-Only) (Signed)
Urology Consult  I have been asked to see the patient by Dr. Dolores Frame, for evaluation and management of right distal ureteral stone, urinary extravasation  Chief Complaint: Epigastric pain  History of Present Illness: Gregory Preston is a 47 y.o. year old male with a personal history as outlined below who presents to the emergency room with epigastric pain.  He was found to have a leukocytosis and in light of his pain, he underwent CT abdomen pelvis with contrast.  This demonstrated a small 2 mm right UVJ stone and significant periureteral, retroperitoneal fluid concerning for possible urinary extravasation.  He returned to CT scan for delayed imaging which in fact confirmed extravasation possibly from the UPJ versus proximal ureter.  He reports that his pain started just hours prior to his emergency room visit.  He did have associated nausea vomiting but no urinary symptoms including no dysuria or gross hematuria.  He denies a personal history of kidney stones.  He denies any trauma, falls, or history of GU pathology.  Past Medical History:  Diagnosis Date   Allergy    Asthma    Eczema    Hand fracture, right 01/2012   Peptic ulcer disease 11/11   s/p NSAID use, resolved   Wears glasses     Past Surgical History:  Procedure Laterality Date   BONE CYST EXCISION  10/11   mandible cyst excised    Home Medications:  No outpatient medications have been marked as taking for the 09/01/23 encounter [facility] Encounter).    Allergies: No Known Allergies  Family History  Problem Relation Age of Onset   Peripheral vascular disease Father    Heart disease Father        pacemaker   COPD Father    Heart disease Maternal Grandfather    Hypertension Neg Hx    Hyperlipidemia Neg Hx    Stroke Neg Hx    Diabetes Neg Hx    Cancer Neg Hx     Social History:  reports that he has quit smoking. His smoking use included cigars and cigarettes. He has a 2.5 pack-year smoking history. He  has never used smokeless tobacco. He reports current alcohol use of about 2.0 standard drinks of alcohol per week. He reports current drug use. Drug: Marijuana.  ROS: A complete review of systems was performed.  All systems are negative except for pertinent findings as noted.  Physical Exam:  Vital signs in last 24 hours: Temp:  [97.9 F (36.6 C)-98.9 F (37.2 C)] 98.9 F (37.2 C) (10/13 0716) Pulse Rate:  [77-95] 91 (10/13 0830) Resp:  [13-20] 13 (10/13 0830) BP: (96-160)/(69-98) 147/76 (10/13 0830) SpO2:  [98 %-100 %] 100 % (10/13 0830) Weight:  [152 kg] 152 kg (10/12 1947) Constitutional:  Alert and oriented, No acute distress HEENT: [place] AT, moist mucus membranes.  Trachea midline, no masses Cardiovascular: Regular rate and rhythm, no clubbing, cyanosis, or edema. Respiratory: Normal respiratory effort, lungs clear bilaterally GI: Abdomen is soft, nontender, nondistended, no abdominal masses GU: No CVA tenderness Skin: No rashes, bruises or suspicious lesions Neurologic: Grossly intact, no focal deficits, moving all 4 extremities Psychiatric: Normal mood and affect   Laboratory Data:  Recent Labs    09/01/23 1949  WBC 17.8*  HGB 15.1  HCT 44.3   Recent Labs    09/01/23 1949  NA 138  K 3.6  CL 104  CO2 20*  GLUCOSE 143*  BUN 14  CREATININE 1.17  CALCIUM 9.6  Component     Latest Ref Rng 09/01/2023  Color, Urine     YELLOW  YELLOW !   Appearance     CLEAR  HAZY !   Specific Gravity, Urine     1.005 - 1.030  1.027   pH     5.0 - 8.0  5.0   Glucose, UA     NEGATIVE mg/dL NEGATIVE   Hgb urine dipstick     NEGATIVE  MODERATE !   Bilirubin Urine     NEGATIVE  NEGATIVE   Ketones, ur     NEGATIVE mg/dL NEGATIVE   Protein     NEGATIVE mg/dL NEGATIVE   Nitrite     NEGATIVE  NEGATIVE   Leukocytes,Ua     NEGATIVE  NEGATIVE   RBC / HPF     0 - 5 RBC/hpf 6-10   WBC, UA     0 - 5 WBC/hpf 0-5   Bacteria, UA     NONE SEEN  NONE SEEN   Squamous Epithelial  / HPF     0 - 5 /HPF 0   Mucus PRESENT     Legend: ! Abnormal  Radiologic Imaging: DG OR UROLOGY CYSTO IMAGE ([facility] ONLY)  Result Date: 09/02/2023 There is no interpretation for this exam.  This order is for images obtained during a surgical procedure.  Please See "Surgeries" Tab for more information regarding the procedure.   CT ABDOMEN PELVIS W CONTRAST  Addendum Date: 09/02/2023   ADDENDUM REPORT: 09/02/2023 02:37 ADDENDUM: Delayed CT images are remarkable for the presence of an extensive amount of extravasated peripelvic, periureteral and retroperitoneal contrast. This extends into the lower abdomen and pelvis and is consistent with proximal ureteral rupture. Results were discussed with Dr. Megan Salon at 2:35 a.m. [place] on September 02, 2023. Electronically Signed   By: Aram Candela M.D.   On: 09/02/2023 02:37   Result Date: 09/02/2023 CLINICAL DATA:  Abdominal pain. EXAM: CT ABDOMEN AND PELVIS WITH CONTRAST TECHNIQUE: Multidetector CT imaging of the abdomen and pelvis was performed using the standard protocol following bolus administration of intravenous contrast. RADIATION DOSE REDUCTION: This exam was performed according to the departmental dose-optimization program which includes automated exposure control, adjustment of the mA and/or kV according to patient size and/or use of iterative reconstruction technique. CONTRAST:  OMNIPAQUE IOHEXOL 350 MG/ML SOLN COMPARISON:  None Available. FINDINGS: Lower chest: No acute abnormality. Hepatobiliary: There is diffuse fatty infiltration of the liver parenchyma. No focal liver abnormality is seen. No gallstones, gallbladder wall thickening, or biliary dilatation. Pancreas: Unremarkable. No pancreatic ductal dilatation or surrounding inflammatory changes. Spleen: Normal in size without focal abnormality. Adrenals/Urinary Tract: Adrenal glands are unremarkable. Kidneys are normal in size, without focal lesions. A 2 mm calcification is seen within  the urinary bladder at the right UVJ. Mild right-sided hydronephrosis and proximal hydroureter are present. Moderate severity right-sided peripelvic and periureteral inflammatory fat stranding is also noted. This extends along the anterior aspect of the right psoas muscle into the lower abdomen and pelvis (approximately 2.10 Hounsfield units). The urinary bladder is otherwise unremarkable. Stomach/Bowel: Stomach is within normal limits. Appendix appears normal. No evidence of bowel wall thickening, distention, or inflammatory changes. Vascular/Lymphatic: Aortic atherosclerosis. No enlarged abdominal or pelvic lymph nodes. Reproductive: Prostate is unremarkable. Other: No abdominal wall hernia or abnormality. No abdominopelvic ascites. Musculoskeletal: No acute or significant osseous findings. IMPRESSION: 1. Obstructing 2 mm calculus at the right UVJ. 2. Right-sided peripelvic, right periureteral and retroperitoneal inflammatory fat stranding,  as described above. This may represent sequelae related to the previously noted obstructing ureteral calculus, however, its severity is greater than expected when compared to the degree of hydronephrosis and hydroureter. Delayed imaging of the current study is recommended to exclude ureteral injury. 3. Hepatic steatosis. 4. Aortic atherosclerosis. Aortic Atherosclerosis (ICD10-I70.0). Electronically Signed: By: Aram Candela M.D. On: 09/02/2023 02:11   US Abdomen Limited RUQ (LIVER/GB)  Result Date: 09/01/2023 CLINICAL DATA:  Right upper quadrant pain x1 day. EXAM: ULTRASOUND ABDOMEN LIMITED RIGHT UPPER QUADRANT COMPARISON:  None Available. FINDINGS: Gallbladder: No gallstones or wall thickening visualized (1.8 mm). No sonographic Murphy sign noted by sonographer. Common bile duct: Diameter: 5.3 mm Liver: No focal lesion identified. The liver parenchyma is nodular in contour and diffusely increased in echogenicity. Portal vein is patent on color Doppler imaging with  normal direction of blood flow towards the liver. Other: None. IMPRESSION: Hepatic steatosis without focal liver lesions. Electronically Signed   By: Aram Candela M.D.   On: 09/01/2023 23:46    Personally reviewed the above imaging.  Agree with radiologic dictation.  Impression/ Plan:  Obstructing right UVJ stone with urinary extravasation/possible proximal ureteral rupture-  Very unusual presentation for small obstructing UVJ stone.  He denies any trauma.  The degree of extravasation is fairly remarkable and as such I have recommended proceeding with urinary diversion in the form of ureteral stent placement.  Plan for retrograde pyelogram to assess the degree of injury more clearly as well as possible ureteroscopy if there is difficulty placing the stent in a retrograde fashion.  If this fails, may consider antegrade placement.  Risk of the procedure including stent pain and intolerance, failure, infection, amongst others discussed in detail.  All questions were answered.  Will likely leave the stent in place at least 4 to 6 weeks and then return to the operating room for second look ureteroscopy to treat the stone at that time but still remains as well as confirm resolution of the ureteral injury prior to removing the stent.  He is agreeable this plan.  All questions were answered in detail.  Consent was confirmed.  Patient was marked on the right.  09/02/2023, 10:23 AM  [name],  MD

## 2023-09-02 NOTE — Anesthesia Procedure Notes (Signed)
Procedure Name: Intubation Date/Time: 09/02/2023 10:31 AM  Performed by: Karoline Caldwell, CRNAPre-anesthesia Checklist: Patient identified, Patient being monitored, Timeout performed, Emergency Drugs available and Suction available Patient Re-evaluated:Patient Re-evaluated prior to induction Oxygen Delivery Method: Circle system utilized Preoxygenation: Pre-oxygenation with 100% oxygen Induction Type: IV induction and Rapid sequence Laryngoscope Size: 4 and McGraph Grade View: Grade I Tube type: Oral Tube size: 7.0 mm Number of attempts: 1 Airway Equipment and Method: Stylet Placement Confirmation: ETT inserted through vocal cords under direct vision, positive ETCO2 and breath sounds checked- equal and bilateral Secured at: 22 cm Tube secured with: Tape Dental Injury: Teeth and Oropharynx as per pre-operative assessment

## 2023-09-02 NOTE — Progress Notes (Addendum)
Progress Note    Gregory Preston  KYH:[id number] DOB: 01-31-1976  DOA: 09/01/2023 PCP: Ronnald Nian, MD      Brief Narrative:    Medical records reviewed and are as summarized below:  Gregory Preston is a 47 y.o. male with medical history significant for Asthma, BMI over 40, OSA, history of peptic ulcer disease, cervical spinal stenosis, who presented to the ED with sudden onset epigastric pain and right-sided abdominal pain that awoke him from sleep.  He had nausea and vomiting. Lipase was normal.  He had leukocytosis with WBC of 17.8.  Right upper quadrant ultrasound showed hepatic steatosis without focal liver lesions CT abdomen and pelvis showing an obstructing 2 mm UVJ calculus with a lot of stranding. Follow-up contrasted study recommended by radiologist showed extensive extravasation of contrast in the periureteral area suggestive of ureteral rupture as follows: ADDENDUM: Delayed CT images are remarkable for the presence of an extensive amount of extravasated peripelvic, periureteral and retroperitoneal contrast. This extends into the lower abdomen and pelvis and is consistent with proximal ureteral rupture.        Assessment/Plan:   Principal Problem:   Right ureteral rupture secondary to obstructing 2mm UVJ calculus Active Problems:   Rupture of ureter   Nephrolithiasis   Obesity, Class III, BMI 40-49.9 (morbid obesity) (HCC)   Asthma in adult   Cervical spinal stenosis    Body mass index is 46.72 kg/m.  (Morbid obesity)   Right UVJ stone, proximal ureteral injury and urinary extravasation: S/p right ureteral stent placement on 09/02/2023.  Analgesics as needed for pain.  Antiemetics as needed for nausea/vomiting.  Follow-up with urologist.   Fatty liver: Patient was made aware of fatty liver noted on abdominal imaging.  He has been advised to lose weight.  It was explained that fatty liver may occasionally progress to liver  cirrhosis.   History of asthma: compensated   Comorbidities include cervical spinal stenosis, OSA  Diet Order             Diet NPO time specified Except for: Sips with Meds  Diet effective now                            Consultants: Urologist  Procedures: Right ureteral stent placement on 09/02/2023    Medications:    Continuous Infusions:  piperacillin-tazobactam (ZOSYN)  IV       Anti-infectives (From admission, onward)    Start     Dose/Rate Route Frequency Ordered Stop   09/02/23 0900  piperacillin-tazobactam (ZOSYN) IVPB 3.375 g        3.375 g 12.5 mL/hr over 240 Minutes Intravenous Every 8 hours 09/02/23 0339     09/02/23 0245  piperacillin-tazobactam (ZOSYN) IVPB 3.375 g        3.375 g 100 mL/hr over 30 Minutes Intravenous  Once 09/02/23 0235 09/02/23 0315              Family Communication/Anticipated D/C date and plan/Code Status   DVT prophylaxis: SCDs Start: 09/02/23 0355     Code Status: Full Code  Family Communication: Plan discussed with his wife at the bedside  Disposition Plan: Plan to discharge home in 1 to 2 days   Status is: Inpatient Remains inpatient appropriate because: Right UVJ stone       Subjective:   Interval events noted.  He complains of pain in the right side of his abdomen.  Non nausea or  vomiting today.  His wife was at the bedside.  Objective:    Vitals:   09/02/23 0715 09/02/23 0716 09/02/23 0730 09/02/23 0800  BP:   (!) 157/80 (!) 140/83  Pulse: 84  77 84  Resp: 13  13 15   Temp:  98.9 F (37.2 C)    TempSrc:  Oral    SpO2: 100%  100% 99%  Weight:      Height:       No data found.   Intake/Output Summary (Last 24 hours) at 09/02/2023 1610 Last data filed at 09/02/2023 0745 Gross per 24 hour  Intake --  Output 600 ml  Net -600 ml   Filed Weights   09/01/23 1947  Weight: (!) 152 kg    Exam:  GEN: NAD SKIN: Warm and dry EYES: No pallor or icterus ENT: MMM CV:  RRR PULM: CTA B ABD: soft, obese, right mid abdominal tenderness, +BS CNS: AAO x 3, non focal EXT: No edema or tenderness        Data Reviewed:   I have personally reviewed following labs and imaging studies:  Labs: Labs show the following:   Basic Metabolic Panel: Recent Labs  Lab 09/01/23 1949  NA 138  K 3.6  CL 104  CO2 20*  GLUCOSE 143*  BUN 14  CREATININE 1.17  CALCIUM 9.6   GFR Estimated Creatinine Clearance: 114.5 mL/min (by C-G formula based on SCr of 1.17 mg/dL). Liver Function Tests: Recent Labs  Lab 09/01/23 1949  AST 37  ALT 43  ALKPHOS 64  BILITOT 0.5  PROT 7.8  ALBUMIN 4.5   Recent Labs  Lab 09/01/23 1949  LIPASE 48   No results for input(s): "AMMONIA" in the last 168 hours. Coagulation profile No results for input(s): "INR", "PROTIME" in the last 168 hours.  CBC: Recent Labs  Lab 09/01/23 1949  WBC 17.8*  NEUTROABS 13.5*  HGB 15.1  HCT 44.3  MCV 90.2  PLT 250   Cardiac Enzymes: No results for input(s): "CKTOTAL", "CKMB", "CKMBINDEX", "TROPONINI" in the last 168 hours. BNP (last 3 results) No results for input(s): "PROBNP" in the last 8760 hours. CBG: No results for input(s): "GLUCAP" in the last 168 hours. D-Dimer: No results for input(s): "DDIMER" in the last 72 hours. Hgb A1c: No results for input(s): "HGBA1C" in the last 72 hours. Lipid Profile: No results for input(s): "CHOL", "HDL", "LDLCALC", "TRIG", "CHOLHDL", "LDLDIRECT" in the last 72 hours. Thyroid function studies: No results for input(s): "TSH", "T4TOTAL", "T3FREE", "THYROIDAB" in the last 72 hours.  Invalid input(s): "FREET3" Anemia work up: No results for input(s): "VITAMINB12", "FOLATE", "FERRITIN", "TIBC", "IRON", "RETICCTPCT" in the last 72 hours. Sepsis Labs: Recent Labs  Lab 09/01/23 1949  WBC 17.8*    Microbiology No results found for this or any previous visit (from the past 240 hour(s)).  Procedures and diagnostic studies:  CT ABDOMEN  PELVIS W CONTRAST  Addendum Date: 09/02/2023   ADDENDUM REPORT: 09/02/2023 02:37 ADDENDUM: Delayed CT images are remarkable for the presence of an extensive amount of extravasated peripelvic, periureteral and retroperitoneal contrast. This extends into the lower abdomen and pelvis and is consistent with proximal ureteral rupture. Results were discussed with Dr. Megan Salon at 2:35 a.m. [place] on September 02, 2023. Electronically Signed   By: Aram Candela M.D.   On: 09/02/2023 02:37   Result Date: 09/02/2023 CLINICAL DATA:  Abdominal pain. EXAM: CT ABDOMEN AND PELVIS WITH CONTRAST TECHNIQUE: Multidetector CT imaging of the abdomen and pelvis was performed using  the standard protocol following bolus administration of intravenous contrast. RADIATION DOSE REDUCTION: This exam was performed according to the departmental dose-optimization program which includes automated exposure control, adjustment of the mA and/or kV according to patient size and/or use of iterative reconstruction technique. CONTRAST:  OMNIPAQUE IOHEXOL 350 MG/ML SOLN COMPARISON:  None Available. FINDINGS: Lower chest: No acute abnormality. Hepatobiliary: There is diffuse fatty infiltration of the liver parenchyma. No focal liver abnormality is seen. No gallstones, gallbladder wall thickening, or biliary dilatation. Pancreas: Unremarkable. No pancreatic ductal dilatation or surrounding inflammatory changes. Spleen: Normal in size without focal abnormality. Adrenals/Urinary Tract: Adrenal glands are unremarkable. Kidneys are normal in size, without focal lesions. A 2 mm calcification is seen within the urinary bladder at the right UVJ. Mild right-sided hydronephrosis and proximal hydroureter are present. Moderate severity right-sided peripelvic and periureteral inflammatory fat stranding is also noted. This extends along the anterior aspect of the right psoas muscle into the lower abdomen and pelvis (approximately 2.10 Hounsfield units). The  urinary bladder is otherwise unremarkable. Stomach/Bowel: Stomach is within normal limits. Appendix appears normal. No evidence of bowel wall thickening, distention, or inflammatory changes. Vascular/Lymphatic: Aortic atherosclerosis. No enlarged abdominal or pelvic lymph nodes. Reproductive: Prostate is unremarkable. Other: No abdominal wall hernia or abnormality. No abdominopelvic ascites. Musculoskeletal: No acute or significant osseous findings. IMPRESSION: 1. Obstructing 2 mm calculus at the right UVJ. 2. Right-sided peripelvic, right periureteral and retroperitoneal inflammatory fat stranding, as described above. This may represent sequelae related to the previously noted obstructing ureteral calculus, however, its severity is greater than expected when compared to the degree of hydronephrosis and hydroureter. Delayed imaging of the current study is recommended to exclude ureteral injury. 3. Hepatic steatosis. 4. Aortic atherosclerosis. Aortic Atherosclerosis (ICD10-I70.0). Electronically Signed: By: Aram Candela M.D. On: 09/02/2023 02:11   US Abdomen Limited RUQ (LIVER/GB)  Result Date: 09/01/2023 CLINICAL DATA:  Right upper quadrant pain x1 day. EXAM: ULTRASOUND ABDOMEN LIMITED RIGHT UPPER QUADRANT COMPARISON:  None Available. FINDINGS: Gallbladder: No gallstones or wall thickening visualized (1.8 mm). No sonographic Murphy sign noted by sonographer. Common bile duct: Diameter: 5.3 mm Liver: No focal lesion identified. The liver parenchyma is nodular in contour and diffusely increased in echogenicity. Portal vein is patent on color Doppler imaging with normal direction of blood flow towards the liver. Other: None. IMPRESSION: Hepatic steatosis without focal liver lesions. Electronically Signed   By: Aram Candela M.D.   On: 09/01/2023 23:46               LOS: 0 days   Gregory Preston  [facility]   Pager on [email or web address]. If 7PM-7AM, please contact night-coverage at  [email or web address]     09/02/2023, 8:28 AM

## 2023-09-02 NOTE — Progress Notes (Signed)
Pharmacy Antibiotic Note  Gregory Preston is a 47 y.o. male admitted on 09/01/2023 with intra-abomdinal infection.  Pharmacy has been consulted for Zosyn dosing.  Plan: Zosyn 3.375 gm IV X 1 given in ED on 10/13 @ 0239. Zosyn 3.375 gm IV Q8H EI ordered to start on 10/13 @ 0900.  Height: 5\' 11"  (180.3 cm) Weight: (!) 152 kg (335 lb) IBW/kg (Calculated) : 75.3  Temp (24hrs), Avg:97.9 F (36.6 C), Min:97.9 F (36.6 C), Max:97.9 F (36.6 C)  Recent Labs  Lab 09/01/23 1949  WBC 17.8*  CREATININE 1.17    Estimated Creatinine Clearance: 114.5 mL/min (by C-G formula based on SCr of 1.17 mg/dL).    No Known Allergies  Antimicrobials this admission:   >>    >>   Dose adjustments this admission:   Microbiology results:  BCx:   UCx:    Sputum:    MRSA PCR:   Thank you for allowing pharmacy to be a part of this patient's care.  Deeanna Beightol D 09/02/2023 3:39 AM

## 2023-09-02 NOTE — Anesthesia Postprocedure Evaluation (Signed)
Anesthesia Post Note  Patient: Gregory Preston  Procedure(s) Performed: CYSTOSCOPY WITH STENT REPLACEMENT (Right: Ureter)  Patient location during evaluation: PACU Anesthesia Type: General Level of consciousness: awake and alert Pain management: pain level controlled Vital Signs Assessment: post-procedure vital signs reviewed and stable Respiratory status: spontaneous breathing, nonlabored ventilation, respiratory function stable and patient connected to nasal cannula oxygen Cardiovascular status: blood pressure returned to baseline and stable Postop Assessment: no apparent nausea or vomiting Anesthetic complications: no   No notable events documented.   Last Vitals:  Vitals:   09/02/23 1245 09/02/23 1247  BP:    Pulse: 92 99  Resp: 12 (!) 26  Temp:    SpO2: 99% 99%    Last Pain:  Vitals:   09/02/23 1145  TempSrc:   PainSc: 0-No pain                 Corinda Gubler

## 2023-09-02 NOTE — Assessment & Plan Note (Signed)
N.p.o. for stent placement Urologist aware and formally consulted Pain control Prophylactic Zosyn

## 2023-09-02 NOTE — Consult Note (Signed)
Urology Consult  I have been asked to see the patient by Dr. Dolores Frame, for evaluation and management of right distal ureteral stone, urinary extravasation  Chief Complaint: Epigastric pain  History of Present Illness: Gregory Preston is a 47 y.o. year old male with a personal history as outlined below who presents to the emergency room with epigastric pain.  He was found to have a leukocytosis and in light of his pain, he underwent CT abdomen pelvis with contrast.  This demonstrated a small 2 mm right UVJ stone and significant periureteral, retroperitoneal fluid concerning for possible urinary extravasation.  He returned to CT scan for delayed imaging which in fact confirmed extravasation possibly from the UPJ versus proximal ureter.  He reports that his pain started just hours prior to his emergency room visit.  He did have associated nausea vomiting but no urinary symptoms including no dysuria or gross hematuria.  He denies a personal history of kidney stones.  He denies any trauma, falls, or history of GU pathology.  Past Medical History:  Diagnosis Date   Allergy    Asthma    Eczema    Hand fracture, right 01/2012   Peptic ulcer disease 11/11   s/p NSAID use, resolved   Wears glasses     Past Surgical History:  Procedure Laterality Date   BONE CYST EXCISION  10/11   mandible cyst excised    Home Medications:  No outpatient medications have been marked as taking for the 09/01/23 encounter [facility] Encounter).    Allergies: No Known Allergies  Family History  Problem Relation Age of Onset   Peripheral vascular disease Father    Heart disease Father        pacemaker   COPD Father    Heart disease Maternal Grandfather    Hypertension Neg Hx    Hyperlipidemia Neg Hx    Stroke Neg Hx    Diabetes Neg Hx    Cancer Neg Hx     Social History:  reports that he has quit smoking. His smoking use included cigars and cigarettes. He has a 2.5 pack-year smoking history. He  has never used smokeless tobacco. He reports current alcohol use of about 2.0 standard drinks of alcohol per week. He reports current drug use. Drug: Marijuana.  ROS: A complete review of systems was performed.  All systems are negative except for pertinent findings as noted.  Physical Exam:  Vital signs in last 24 hours: Temp:  [97.9 F (36.6 C)-98.9 F (37.2 C)] 98.9 F (37.2 C) (10/13 0716) Pulse Rate:  [77-95] 91 (10/13 0830) Resp:  [13-20] 13 (10/13 0830) BP: (96-160)/(69-98) 147/76 (10/13 0830) SpO2:  [98 %-100 %] 100 % (10/13 0830) Weight:  [152 kg] 152 kg (10/12 1947) Constitutional:  Alert and oriented, No acute distress HEENT: [place] AT, moist mucus membranes.  Trachea midline, no masses Cardiovascular: Regular rate and rhythm, no clubbing, cyanosis, or edema. Respiratory: Normal respiratory effort, lungs clear bilaterally GI: Abdomen is soft, nontender, nondistended, no abdominal masses GU: No CVA tenderness Skin: No rashes, bruises or suspicious lesions Neurologic: Grossly intact, no focal deficits, moving all 4 extremities Psychiatric: Normal mood and affect   Laboratory Data:  Recent Labs    09/01/23 1949  WBC 17.8*  HGB 15.1  HCT 44.3   Recent Labs    09/01/23 1949  NA 138  K 3.6  CL 104  CO2 20*  GLUCOSE 143*  BUN 14  CREATININE 1.17  CALCIUM 9.6  Component     Latest Ref Rng 09/01/2023  Color, Urine     YELLOW  YELLOW !   Appearance     CLEAR  HAZY !   Specific Gravity, Urine     1.005 - 1.030  1.027   pH     5.0 - 8.0  5.0   Glucose, UA     NEGATIVE mg/dL NEGATIVE   Hgb urine dipstick     NEGATIVE  MODERATE !   Bilirubin Urine     NEGATIVE  NEGATIVE   Ketones, ur     NEGATIVE mg/dL NEGATIVE   Protein     NEGATIVE mg/dL NEGATIVE   Nitrite     NEGATIVE  NEGATIVE   Leukocytes,Ua     NEGATIVE  NEGATIVE   RBC / HPF     0 - 5 RBC/hpf 6-10   WBC, UA     0 - 5 WBC/hpf 0-5   Bacteria, UA     NONE SEEN  NONE SEEN   Squamous Epithelial  / HPF     0 - 5 /HPF 0   Mucus PRESENT     Legend: ! Abnormal  Radiologic Imaging: DG OR UROLOGY CYSTO IMAGE ([facility] ONLY)  Result Date: 09/02/2023 There is no interpretation for this exam.  This order is for images obtained during a surgical procedure.  Please See "Surgeries" Tab for more information regarding the procedure.   CT ABDOMEN PELVIS W CONTRAST  Addendum Date: 09/02/2023   ADDENDUM REPORT: 09/02/2023 02:37 ADDENDUM: Delayed CT images are remarkable for the presence of an extensive amount of extravasated peripelvic, periureteral and retroperitoneal contrast. This extends into the lower abdomen and pelvis and is consistent with proximal ureteral rupture. Results were discussed with Dr. Megan Salon at 2:35 a.m. [place] on September 02, 2023. Electronically Signed   By: Aram Candela M.D.   On: 09/02/2023 02:37   Result Date: 09/02/2023 CLINICAL DATA:  Abdominal pain. EXAM: CT ABDOMEN AND PELVIS WITH CONTRAST TECHNIQUE: Multidetector CT imaging of the abdomen and pelvis was performed using the standard protocol following bolus administration of intravenous contrast. RADIATION DOSE REDUCTION: This exam was performed according to the departmental dose-optimization program which includes automated exposure control, adjustment of the mA and/or kV according to patient size and/or use of iterative reconstruction technique. CONTRAST:  OMNIPAQUE IOHEXOL 350 MG/ML SOLN COMPARISON:  None Available. FINDINGS: Lower chest: No acute abnormality. Hepatobiliary: There is diffuse fatty infiltration of the liver parenchyma. No focal liver abnormality is seen. No gallstones, gallbladder wall thickening, or biliary dilatation. Pancreas: Unremarkable. No pancreatic ductal dilatation or surrounding inflammatory changes. Spleen: Normal in size without focal abnormality. Adrenals/Urinary Tract: Adrenal glands are unremarkable. Kidneys are normal in size, without focal lesions. A 2 mm calcification is seen within  the urinary bladder at the right UVJ. Mild right-sided hydronephrosis and proximal hydroureter are present. Moderate severity right-sided peripelvic and periureteral inflammatory fat stranding is also noted. This extends along the anterior aspect of the right psoas muscle into the lower abdomen and pelvis (approximately 2.10 Hounsfield units). The urinary bladder is otherwise unremarkable. Stomach/Bowel: Stomach is within normal limits. Appendix appears normal. No evidence of bowel wall thickening, distention, or inflammatory changes. Vascular/Lymphatic: Aortic atherosclerosis. No enlarged abdominal or pelvic lymph nodes. Reproductive: Prostate is unremarkable. Other: No abdominal wall hernia or abnormality. No abdominopelvic ascites. Musculoskeletal: No acute or significant osseous findings. IMPRESSION: 1. Obstructing 2 mm calculus at the right UVJ. 2. Right-sided peripelvic, right periureteral and retroperitoneal inflammatory fat stranding,  as described above. This may represent sequelae related to the previously noted obstructing ureteral calculus, however, its severity is greater than expected when compared to the degree of hydronephrosis and hydroureter. Delayed imaging of the current study is recommended to exclude ureteral injury. 3. Hepatic steatosis. 4. Aortic atherosclerosis. Aortic Atherosclerosis (ICD10-I70.0). Electronically Signed: By: Aram Candela M.D. On: 09/02/2023 02:11   US Abdomen Limited RUQ (LIVER/GB)  Result Date: 09/01/2023 CLINICAL DATA:  Right upper quadrant pain x1 day. EXAM: ULTRASOUND ABDOMEN LIMITED RIGHT UPPER QUADRANT COMPARISON:  None Available. FINDINGS: Gallbladder: No gallstones or wall thickening visualized (1.8 mm). No sonographic Murphy sign noted by sonographer. Common bile duct: Diameter: 5.3 mm Liver: No focal lesion identified. The liver parenchyma is nodular in contour and diffusely increased in echogenicity. Portal vein is patent on color Doppler imaging with  normal direction of blood flow towards the liver. Other: None. IMPRESSION: Hepatic steatosis without focal liver lesions. Electronically Signed   By: Aram Candela M.D.   On: 09/01/2023 23:46    Personally reviewed the above imaging.  Agree with radiologic dictation.  Impression/ Plan:  Obstructing right UVJ stone with urinary extravasation/possible proximal ureteral rupture-  Very unusual presentation for small obstructing UVJ stone.  He denies any trauma.  The degree of extravasation is fairly remarkable and as such I have recommended proceeding with urinary diversion in the form of ureteral stent placement.  Plan for retrograde pyelogram to assess the degree of injury more clearly as well as possible ureteroscopy if there is difficulty placing the stent in a retrograde fashion.  If this fails, may consider antegrade placement.  Risk of the procedure including stent pain and intolerance, failure, infection, amongst others discussed in detail.  All questions were answered.  Will likely leave the stent in place at least 4 to 6 weeks and then return to the operating room for second look ureteroscopy to treat the stone at that time but still remains as well as confirm resolution of the ureteral injury prior to removing the stent.  He is agreeable this plan.  All questions were answered in detail.  Consent was confirmed.  Patient was marked on the right.  09/02/2023, 10:23 AM  [name],  MD

## 2023-09-03 ENCOUNTER — Other Ambulatory Visit: Payer: Self-pay | Admitting: Physician Assistant

## 2023-09-03 ENCOUNTER — Encounter: Payer: Self-pay | Admitting: Urology

## 2023-09-03 DIAGNOSIS — N201 Calculus of ureter: Secondary | ICD-10-CM

## 2023-09-03 LAB — CBC WITH DIFFERENTIAL/PLATELET
Abs Immature Granulocytes: 0.12 10*3/uL — ABNORMAL HIGH (ref 0.00–0.07)
Basophils Absolute: 0 10*3/uL (ref 0.0–0.1)
Basophils Relative: 0 %
Eosinophils Absolute: 0 10*3/uL (ref 0.0–0.5)
Eosinophils Relative: 0 %
HCT: 43.8 % (ref 39.0–52.0)
Hemoglobin: 14.9 g/dL (ref 13.0–17.0)
Immature Granulocytes: 1 %
Lymphocytes Relative: 19 %
Lymphs Abs: 3.4 10*3/uL (ref 0.7–4.0)
MCH: 30.7 pg (ref 26.0–34.0)
MCHC: 34 g/dL (ref 30.0–36.0)
MCV: 90.1 fL (ref 80.0–100.0)
Monocytes Absolute: 1.2 10*3/uL — ABNORMAL HIGH (ref 0.1–1.0)
Monocytes Relative: 7 %
Neutro Abs: 13.3 10*3/uL — ABNORMAL HIGH (ref 1.7–7.7)
Neutrophils Relative %: 73 %
Platelets: 257 10*3/uL (ref 150–400)
RBC: 4.86 MIL/uL (ref 4.22–5.81)
RDW: 12.6 % (ref 11.5–15.5)
WBC: 18.1 10*3/uL — ABNORMAL HIGH (ref 4.0–10.5)
nRBC: 0 % (ref 0.0–0.2)

## 2023-09-03 LAB — BASIC METABOLIC PANEL
Anion gap: 13 (ref 5–15)
BUN: 14 mg/dL (ref 6–20)
CO2: 24 mmol/L (ref 22–32)
Calcium: 9.6 mg/dL (ref 8.9–10.3)
Chloride: 103 mmol/L (ref 98–111)
Creatinine, Ser: 1.07 mg/dL (ref 0.61–1.24)
GFR, Estimated: >60 mL/min (ref >60)
Glucose, Bld: 104 mg/dL — ABNORMAL HIGH (ref 70–99)
Potassium: 4.2 mmol/L (ref 3.5–5.1)
Sodium: 140 mmol/L (ref 135–145)

## 2023-09-03 LAB — HIV ANTIBODY (ROUTINE TESTING W REFLEX): HIV Screen 4th Generation wRfx: NONREACTIVE

## 2023-09-03 MED ORDER — SODIUM CHLORIDE 0.9 % IV SOLN: INTRAVENOUS | Status: DC | PRN

## 2023-09-03 MED ORDER — OXYBUTYNIN CHLORIDE 5 MG PO TABS: 5.0000 mg | ORAL_TABLET | Freq: Three times a day (TID) | ORAL | 0 refills | Status: DC | PRN

## 2023-09-03 MED ORDER — TAMSULOSIN HCL 0.4 MG PO CAPS: 0.4000 mg | ORAL_CAPSULE | Freq: Every day | ORAL | 0 refills | Status: DC

## 2023-09-03 NOTE — Progress Notes (Signed)
Discharge instructions were reviewed with patient and family. Questions were encourage and answered. IV was removed and belongings were collected by patient.

## 2023-09-03 NOTE — Discharge Summary (Signed)
Physician Discharge Summary   Patient: Gregory Preston MRN: [id number] DOB: 02-07-1976  Admit date:     09/01/2023  Discharge date: 09/03/23  Discharge Physician: Lurene Shadow   PCP: Ronnald Nian, MD   Recommendations at discharge:   Follow-up with Dr. Apolinar Junes, urologist, in 4 weeks. Follow-up with PCP in 2 weeks  Discharge Diagnoses: Principal Problem:   Right ureteral rupture secondary to obstructing 2mm UVJ calculus Active Problems:   Rupture of ureter   Nephrolithiasis   Obesity, Class III, BMI 40-49.9 (morbid obesity) (HCC)   Asthma in adult   Cervical spinal stenosis  Resolved Problems:   * No resolved hospital problems. *  Hospital Course:  Gregory Preston is a 47 y.o. male with medical history significant for Asthma, BMI over 40, OSA, history of peptic ulcer disease, cervical spinal stenosis, who presented to the ED with sudden onset epigastric pain and right-sided abdominal pain that awoke him from sleep.  He had nausea and vomiting. Lipase was normal.  He had leukocytosis with WBC of 17.8.   Right upper quadrant ultrasound showed hepatic steatosis without focal liver lesions CT abdomen and pelvis showing an obstructing 2 mm UVJ calculus with a lot of stranding. Follow-up contrasted study recommended by radiologist showed extensive extravasation of contrast in the periureteral area suggestive of ureteral rupture as follows: ADDENDUM: Delayed CT images are remarkable for the presence of an extensive amount of extravasated peripelvic, periureteral and retroperitoneal contrast. This extends into the lower abdomen and pelvis and is consistent with proximal ureteral rupture.    Assessment and Plan:   Right UVJ stone, proximal ureteral injury and urinary extravasation: S/p right ureteral stent placement on 09/02/2023.   Urologist recommended Flomax 0.4 mg daily and oxybutynin 5 mg every 8 hours as needed for bladder spasms. Follow-up with Dr. Apolinar Junes, urologist  in 4 weeks     Fatty liver: Patient was made aware of fatty liver noted on abdominal imaging.  He has been advised to lose weight.  It was explained that fatty liver may occasionally progress to liver cirrhosis.     History of asthma: compensated     Comorbidities include cervical spinal stenosis, OSA   His condition has improved significantly.  Abdominal pain has completely resolved.  He is deemed stable for discharge to home today. Discharge plan was discussed with the patient and his wife at the bedside.  Patient requested a note to return to work on Wednesday, 09/05/2023.      Consultants: Urologist Procedures performed: Right ureteral stent placement on 09/02/2023 Disposition: Home Diet recommendation:  Discharge Diet Orders (From admission, onward)     Start     Ordered   09/03/23 0000  Diet - low sodium heart healthy        09/03/23 1114           Cardiac diet DISCHARGE MEDICATION: Allergies as of 09/03/2023   No Known Allergies      Medication List     STOP taking these medications    Aleve PM 220-25 MG Tabs Generic drug: Naproxen Sod-diphenhydrAMINE   HYDROcodone-acetaminophen 5-325 MG tablet Commonly known as: Norco       TAKE these medications    acetaminophen 500 MG tablet Commonly known as: TYLENOL Take 1,000 mg by mouth every 6 (six) hours as needed.   albuterol 108 (90 Base) MCG/ACT inhaler Commonly known as: VENTOLIN HFA TAKE 2 PUFFS BY MOUTH EVERY 6 HOURS AS NEEDED FOR WHEEZE OR SHORTNESS OF BREATH   fluticasone  furoate-vilanterol 200-25 MCG/ACT Aepb Commonly known as: Breo Ellipta INHALE 1 PUFF BY MOUTH EVERY DAY   gabapentin 100 MG capsule Commonly known as: NEURONTIN Take 1 capsule (100 mg total) by mouth 2 (two) times daily.   oxybutynin 5 MG tablet Commonly known as: DITROPAN Take 1 tablet (5 mg total) by mouth every 8 (eight) hours as needed for bladder spasms.   tamsulosin 0.4 MG Caps capsule Commonly known as:  Flomax Take 1 capsule (0.4 mg total) by mouth daily after supper.        Follow-up Information     [name], MD. Schedule an appointment as soon as possible for a visit in 1 month(s).   Specialty: Urology Contact information: [address] [place] [address] [phone]                Discharge Exam: Ceasar Mons Weights   09/01/23 1947  Weight: (!) 152 kg   GEN: No acute distress SKIN: Warm and dry EYES: No pallor or icterus ENT: MMM CV: RRR PULM: CTA B ABD: soft, obese, NT, +BS CNS: AAO x 3, non focal EXT: No edema or tenderness   Condition at discharge: good  The results of significant diagnostics from this hospitalization (including imaging, microbiology, ancillary and laboratory) are listed below for reference.   Imaging Studies: DG OR UROLOGY CYSTO IMAGE ([facility] ONLY)  Result Date: 09/02/2023 There is no interpretation for this exam.  This order is for images obtained during a surgical procedure.  Please See "Surgeries" Tab for more information regarding the procedure.   CT ABDOMEN PELVIS W CONTRAST  Addendum Date: 09/02/2023   ADDENDUM REPORT: 09/02/2023 02:37 ADDENDUM: Delayed CT images are remarkable for the presence of an extensive amount of extravasated peripelvic, periureteral and retroperitoneal contrast. This extends into the lower abdomen and pelvis and is consistent with proximal ureteral rupture. Results were discussed with Dr. Megan Salon at 2:35 a.m. [place] on September 02, 2023. Electronically Signed   By: Aram Candela M.D.   On: 09/02/2023 02:37   Result Date: 09/02/2023 CLINICAL DATA:  Abdominal pain. EXAM: CT ABDOMEN AND PELVIS WITH CONTRAST TECHNIQUE: Multidetector CT imaging of the abdomen and pelvis was performed using the standard protocol following bolus administration of intravenous contrast. RADIATION DOSE REDUCTION: This exam was performed according to the departmental dose-optimization program which includes  automated exposure control, adjustment of the mA and/or kV according to patient size and/or use of iterative reconstruction technique. CONTRAST:  OMNIPAQUE IOHEXOL 350 MG/ML SOLN COMPARISON:  None Available. FINDINGS: Lower chest: No acute abnormality. Hepatobiliary: There is diffuse fatty infiltration of the liver parenchyma. No focal liver abnormality is seen. No gallstones, gallbladder wall thickening, or biliary dilatation. Pancreas: Unremarkable. No pancreatic ductal dilatation or surrounding inflammatory changes. Spleen: Normal in size without focal abnormality. Adrenals/Urinary Tract: Adrenal glands are unremarkable. Kidneys are normal in size, without focal lesions. A 2 mm calcification is seen within the urinary bladder at the right UVJ. Mild right-sided hydronephrosis and proximal hydroureter are present. Moderate severity right-sided peripelvic and periureteral inflammatory fat stranding is also noted. This extends along the anterior aspect of the right psoas muscle into the lower abdomen and pelvis (approximately 2.10 Hounsfield units). The urinary bladder is otherwise unremarkable. Stomach/Bowel: Stomach is within normal limits. Appendix appears normal. No evidence of bowel wall thickening, distention, or inflammatory changes. Vascular/Lymphatic: Aortic atherosclerosis. No enlarged abdominal or pelvic lymph nodes. Reproductive: Prostate is unremarkable. Other: No abdominal wall hernia or abnormality. No abdominopelvic ascites. Musculoskeletal:  No acute or significant osseous findings. IMPRESSION: 1. Obstructing 2 mm calculus at the right UVJ. 2. Right-sided peripelvic, right periureteral and retroperitoneal inflammatory fat stranding, as described above. This may represent sequelae related to the previously noted obstructing ureteral calculus, however, its severity is greater than expected when compared to the degree of hydronephrosis and hydroureter. Delayed imaging of the current study is  recommended to exclude ureteral injury. 3. Hepatic steatosis. 4. Aortic atherosclerosis. Aortic Atherosclerosis (ICD10-I70.0). Electronically Signed: By: Aram Candela M.D. On: 09/02/2023 02:11   US Abdomen Limited RUQ (LIVER/GB)  Result Date: 09/01/2023 CLINICAL DATA:  Right upper quadrant pain x1 day. EXAM: ULTRASOUND ABDOMEN LIMITED RIGHT UPPER QUADRANT COMPARISON:  None Available. FINDINGS: Gallbladder: No gallstones or wall thickening visualized (1.8 mm). No sonographic Murphy sign noted by sonographer. Common bile duct: Diameter: 5.3 mm Liver: No focal lesion identified. The liver parenchyma is nodular in contour and diffusely increased in echogenicity. Portal vein is patent on color Doppler imaging with normal direction of blood flow towards the liver. Other: None. IMPRESSION: Hepatic steatosis without focal liver lesions. Electronically Signed   By: Aram Candela M.D.   On: 09/01/2023 23:46    Microbiology: No results found for this or any previous visit.  Labs: CBC: Recent Labs  Lab 09/01/23 1949 09/03/23 0519  WBC 17.8* 18.1*  NEUTROABS 13.5* 13.3*  HGB 15.1 14.9  HCT 44.3 43.8  MCV 90.2 90.1  PLT 250 257   Basic Metabolic Panel: Recent Labs  Lab 09/01/23 1949 09/03/23 0519  NA 138 140  K 3.6 4.2  CL 104 103  CO2 20* 24  GLUCOSE 143* 104*  BUN 14 14  CREATININE 1.17 1.07  CALCIUM 9.6 9.6   Liver Function Tests: Recent Labs  Lab 09/01/23 1949  AST 37  ALT 43  ALKPHOS 64  BILITOT 0.5  PROT 7.8  ALBUMIN 4.5   CBG: No results for input(s): "GLUCAP" in the last 168 hours.  Discharge time spent: greater than 30 minutes.  Signed: Lurene Shadow, MD [facility] 09/03/2023

## 2023-09-03 NOTE — TOC CM/SW Note (Signed)
Transition of Care [facility]) - Inpatient Brief Assessment   Patient Details  Name: Kuper Rennels MRN: [id number] Date of Birth: Mar 14, 1976  Transition of Care [facility]) CM/SW Contact:    Chapman Fitch, RN Phone Number: 09/03/2023, 10:16 AM   Clinical Narrative:   Transition of Care [facility]) Screening Note   Patient Details  Name: Aayansh Codispoti Date of Birth: 01-19-1976   Transition of Care [facility]) CM/SW Contact:    Chapman Fitch, RN Phone Number: 09/03/2023, 10:17 AM    Transition of Care Department [facility]) has reviewed patient and no TOC needs have been identified at this time. If new patient transition needs arise, please place a TOC consult.    Transition of Care Asessment: Insurance and Status: Insurance coverage has been reviewed Patient has primary care physician: Yes     Prior/Current Home Services: No current home services Social Determinants of Health Reivew: SDOH reviewed no interventions necessary Readmission risk has been reviewed: Yes Transition of care needs: no transition of care needs at this time

## 2023-09-03 NOTE — Plan of Care (Signed)

## 2023-09-03 NOTE — Progress Notes (Signed)
Urology Inpatient Progress Note  Subjective: No acute events overnight.  He is afebrile, VSS. WBC count stable, 18.1.  Hemoglobin stable, 14.9. Creatinine down, 1.07. He states he feels much better today.  He is having some burning and irritation at the tip of his penis and generalized abdominal soreness.  He has noticed some urinary frequency.  Anti-infectives: Anti-infectives (From admission, onward)    Start     Dose/Rate Route Frequency Ordered Stop   09/02/23 0900  piperacillin-tazobactam (ZOSYN) IVPB 3.375 g  Status:  Discontinued        3.375 g 12.5 mL/hr over 240 Minutes Intravenous Every 8 hours 09/02/23 0339 09/03/23 0754   09/02/23 0245  piperacillin-tazobactam (ZOSYN) IVPB 3.375 g        3.375 g 100 mL/hr over 30 Minutes Intravenous  Once 09/02/23 0235 09/02/23 0315       Current Facility-Administered Medications  Medication Dose Route Frequency Provider Last Rate Last Admin   0.9 %  sodium chloride infusion   Intravenous PRN Lurene Shadow, MD   Stopped at 09/03/23 0525   acetaminophen (TYLENOL) tablet 650 mg  650 mg Oral Q6H PRN Andris Baumann, MD       Or   acetaminophen (TYLENOL) suppository 650 mg  650 mg Rectal Q6H PRN Andris Baumann, MD       albuterol (PROVENTIL) (2.5 MG/3ML) 0.083% nebulizer solution 3 mL  3 mL Inhalation Q4H PRN Andris Baumann, MD       HYDROcodone-acetaminophen (NORCO/VICODIN) 5-325 MG per tablet 1-2 tablet  1-2 tablet Oral Q4H PRN Andris Baumann, MD       HYDROmorphone (DILAUDID) injection 1 mg  1 mg Intravenous Q3H PRN Andris Baumann, MD   1 mg at 09/02/23 0743   ondansetron (ZOFRAN) tablet 4 mg  4 mg Oral Q6H PRN Andris Baumann, MD       Or   ondansetron [facility]) injection 4 mg  4 mg Intravenous Q6H PRN Andris Baumann, MD       Objective: Vital signs in last 24 hours: Temp:  [97.9 F (36.6 C)-99.4 F (37.4 C)] 98.2 F (36.8 C) (10/14 0925) Pulse Rate:  [90-108] 90 (10/14 0925) Resp:  [12-26] 18 (10/14 0925) BP:  (133-156)/(73-118) 139/91 (10/14 0925) SpO2:  [96 %-100 %] 98 % (10/14 0925)  Intake/Output from previous day: 10/13 0701 - 10/14 0700 In: 803.7 [P.O.:680; I.V.:3.7; IV Piggyback:120.1] Out: 1625 [Urine:1625] Intake/Output this shift: No intake/output data recorded.  Physical Exam Vitals and nursing note reviewed.  Constitutional:      General: He is not in acute distress.    Appearance: He is not ill-appearing, toxic-appearing or diaphoretic.  HENT:     Head: Normocephalic and atraumatic.  Pulmonary:     Effort: Pulmonary effort is normal. No respiratory distress.  Skin:    General: Skin is warm and dry.  Neurological:     Mental Status: He is alert and oriented to person, place, and time.  Psychiatric:        Mood and Affect: Mood normal.        Behavior: Behavior normal.    Lab Results:  Recent Labs    09/01/23 1949 09/03/23 0519  WBC 17.8* 18.1*  HGB 15.1 14.9  HCT 44.3 43.8  PLT 250 257   BMET Recent Labs    09/01/23 1949 09/03/23 0519  NA 138 140  K 3.6 4.2  CL 104 103  CO2 20* 24  GLUCOSE 143* 104*  BUN  14 14  CREATININE 1.17 1.07  CALCIUM 9.6 9.6   Studies/Results: DG OR UROLOGY CYSTO IMAGE ([facility] ONLY)  Result Date: 09/02/2023 There is no interpretation for this exam.  This order is for images obtained during a surgical procedure.  Please See "Surgeries" Tab for more information regarding the procedure.   CT ABDOMEN PELVIS W CONTRAST  Addendum Date: 09/02/2023   ADDENDUM REPORT: 09/02/2023 02:37 ADDENDUM: Delayed CT images are remarkable for the presence of an extensive amount of extravasated peripelvic, periureteral and retroperitoneal contrast. This extends into the lower abdomen and pelvis and is consistent with proximal ureteral rupture. Results were discussed with Dr. Megan Salon at 2:35 a.m. [place] on September 02, 2023. Electronically Signed   By: Aram Candela M.D.   On: 09/02/2023 02:37   Result Date: 09/02/2023 CLINICAL DATA:  Abdominal  pain. EXAM: CT ABDOMEN AND PELVIS WITH CONTRAST TECHNIQUE: Multidetector CT imaging of the abdomen and pelvis was performed using the standard protocol following bolus administration of intravenous contrast. RADIATION DOSE REDUCTION: This exam was performed according to the departmental dose-optimization program which includes automated exposure control, adjustment of the mA and/or kV according to patient size and/or use of iterative reconstruction technique. CONTRAST:  OMNIPAQUE IOHEXOL 350 MG/ML SOLN COMPARISON:  None Available. FINDINGS: Lower chest: No acute abnormality. Hepatobiliary: There is diffuse fatty infiltration of the liver parenchyma. No focal liver abnormality is seen. No gallstones, gallbladder wall thickening, or biliary dilatation. Pancreas: Unremarkable. No pancreatic ductal dilatation or surrounding inflammatory changes. Spleen: Normal in size without focal abnormality. Adrenals/Urinary Tract: Adrenal glands are unremarkable. Kidneys are normal in size, without focal lesions. A 2 mm calcification is seen within the urinary bladder at the right UVJ. Mild right-sided hydronephrosis and proximal hydroureter are present. Moderate severity right-sided peripelvic and periureteral inflammatory fat stranding is also noted. This extends along the anterior aspect of the right psoas muscle into the lower abdomen and pelvis (approximately 2.10 Hounsfield units). The urinary bladder is otherwise unremarkable. Stomach/Bowel: Stomach is within normal limits. Appendix appears normal. No evidence of bowel wall thickening, distention, or inflammatory changes. Vascular/Lymphatic: Aortic atherosclerosis. No enlarged abdominal or pelvic lymph nodes. Reproductive: Prostate is unremarkable. Other: No abdominal wall hernia or abnormality. No abdominopelvic ascites. Musculoskeletal: No acute or significant osseous findings. IMPRESSION: 1. Obstructing 2 mm calculus at the right UVJ. 2. Right-sided peripelvic, right  periureteral and retroperitoneal inflammatory fat stranding, as described above. This may represent sequelae related to the previously noted obstructing ureteral calculus, however, its severity is greater than expected when compared to the degree of hydronephrosis and hydroureter. Delayed imaging of the current study is recommended to exclude ureteral injury. 3. Hepatic steatosis. 4. Aortic atherosclerosis. Aortic Atherosclerosis (ICD10-I70.0). Electronically Signed: By: Aram Candela M.D. On: 09/02/2023 02:11   US Abdomen Limited RUQ (LIVER/GB)  Result Date: 09/01/2023 CLINICAL DATA:  Right upper quadrant pain x1 day. EXAM: ULTRASOUND ABDOMEN LIMITED RIGHT UPPER QUADRANT COMPARISON:  None Available. FINDINGS: Gallbladder: No gallstones or wall thickening visualized (1.8 mm). No sonographic Murphy sign noted by sonographer. Common bile duct: Diameter: 5.3 mm Liver: No focal lesion identified. The liver parenchyma is nodular in contour and diffusely increased in echogenicity. Portal vein is patent on color Doppler imaging with normal direction of blood flow towards the liver. Other: None. IMPRESSION: Hepatic steatosis without focal liver lesions. Electronically Signed   By: Aram Candela M.D.   On: 09/01/2023 23:46    Assessment & Plan: 47 year old male POD 1 from right ureteral stent placement with  Dr. Apolinar Junes for management of a right UVJ stone with urinary extravasation/proximal ureteral injury.  He is clinically improving today and pain is significantly improved.  He is having some mild stent discomfort.  We discussed that he will need definitive stone management in the form of an outpatient right ureteroscopy with laser lithotripsy in 4 to 6 weeks.  We will also plan for IntraOp retrograde pyelogram and possible stent removal at that time if his urinary extravasation has resolved at that point.  Otherwise, we will keep the stent in longer to allow additional time for healing.  He and his  wife are in agreement with this plan.  Our scheduler will contact him to get his procedure booked once he is home.  Would recommend sending him on Flomax 0.4 mg daily and oxybutynin 5 mg every 8 hours as needed for management of mild stent discomfort.  Carman Ching, PA-C 09/03/2023

## 2023-09-04 ENCOUNTER — Other Ambulatory Visit: Payer: Self-pay

## 2023-09-04 ENCOUNTER — Telehealth: Payer: Self-pay

## 2023-09-04 DIAGNOSIS — N201 Calculus of ureter: Secondary | ICD-10-CM

## 2023-09-04 NOTE — Progress Notes (Signed)
Surgical Physician Order Form [facility]  Dr. Apolinar Junes * Scheduling expectation : 4-6 weeks  *Length of Case:   *Clearance needed: no  *Anticoagulation Instructions: N/A  *Aspirin Instructions: N/A  *Post-op visit Date/Instructions:  TBD  *Diagnosis: Right ureteral stone, ureteral rupture  *Procedure: right cystoscopy with retrograde pyelogram, possible right ureteral stent removal, right ureteroscopy with laser lithotripsy   Additional orders: N/A  -Admit type: OUTpatient  -Anesthesia: General  -VTE Prophylaxis Standing Order SCD's       Other:   -Standing Lab Orders Per Anesthesia    Lab other: UA&Urine Culture  -Standing Test orders EKG/Chest x-ray per Anesthesia       Test other:   - Medications:  Ancef 2gm IV  -Other orders:  N/A

## 2023-09-04 NOTE — Transitions of Care (Post Inpatient/ED Visit) (Cosign Needed)
   09/04/2023  Name: Gregory Preston MRN: [id number] DOB: 01-23-1976  Today's TOC FU Call Status: Completed 09/04/2023 Today's TOC FU Call Status:: Successful TOC FU Call Completed TOC FU Call Complete Date: 09/04/23 Patient's Name and Date of Birth confirmed. yes  Transition Care Management Follow-up Telephone Call:  Date of Discharge: 09/03/23 Discharge Facility: [facility]) Type of Discharge: Inpatient Admission Primary Inpatient Discharge Diagnosis:: right ureter rupture How have you been since you were released from the hospital?: Better Any questions or concerns?: No  Items Reviewed: Did you receive and understand the discharge instructions provided?: Yes Medications obtained,verified, and reconciled?: Yes (Medications Reviewed) Any new allergies since your discharge?: No Dietary orders reviewed?: Yes Type of Diet Ordered:: low sodium Do you have support at home?: Yes People in Home: spouse  Medications Reviewed Today: Medications Reviewed Today     Reviewed by Aymar Whitfill, Jordan Hawks, CMA (Certified Medical Assistant) on 09/04/23 at 1045  Med List Status: <None>   Medication Order Taking? Sig Documenting Provider Last Dose Status Informant  acetaminophen (TYLENOL) 500 MG tablet [id number] Yes Take 1,000 mg by mouth every 6 (six) hours as needed. [provider] Taking Active            Med Note Katrinka Blazing, Corliss Blacker May 16, 2023 10:46 AM) Rochele Pages two yesterday at 1pm  albuterol (VENTOLIN HFA) 108 (90 Base) MCG/ACT inhaler [id number] Yes TAKE 2 PUFFS BY MOUTH EVERY 6 HOURS AS NEEDED FOR WHEEZE OR SHORTNESS OF BREATH Ronnald Nian, MD Taking Active   fluticasone furoate-vilanterol (BREO ELLIPTA) 200-25 MCG/ACT AEPB [id number] Yes INHALE 1 PUFF BY MOUTH EVERY DAY Ronnald Nian, MD Taking Active   gabapentin (NEURONTIN) 100 MG capsule [id number] Yes Take 1 capsule (100 mg total) by mouth 2 (two) times daily. Tysinger, Kermit Balo, PA-C Taking Active    oxybutynin (DITROPAN) 5 MG tablet [id number] Yes Take 1 tablet (5 mg total) by mouth every 8 (eight) hours as needed for bladder spasms. Lurene Shadow, MD Taking Active   tamsulosin [facility]) 0.4 MG CAPS capsule [id number] Yes Take 1 capsule (0.4 mg total) by mouth daily after supper. Lurene Shadow, MD Taking Active             Home Care and Equipment/Supplies: Were Home Health Services Ordered?: NA Any new equipment or medical supplies ordered?: NA  Functional Questionnaire: Do you need assistance with bathing/showering or dressing?: No Do you need assistance with meal preparation?: No Do you need assistance with eating?: No Do you have difficulty maintaining continence: No Do you need assistance with getting out of bed/getting out of a chair/moving?: No Do you have difficulty managing or taking your medications?: No  Follow up appointments reviewed: PCP Follow-up appointment confirmed?: Yes Date of PCP follow-up appointment?: 09/13/23 Follow-up Provider: Dr. Sharlot Gowda Specialist [facility] Follow-up appointment confirmed?: No Reason Specialist Follow-Up Not Confirmed: Patient has Specialist Provider Number and will Call for Appointment Do you need transportation to your follow-up appointment?: No Do you understand care options if your condition(s) worsen?: Yes-patient verbalized understanding     Natarsha Hurwitz, CMA  [facility] AWV Team Direct Dial: [phone]

## 2023-09-05 ENCOUNTER — Telehealth: Payer: Self-pay

## 2023-09-05 NOTE — Transitions of Care (Post Inpatient/ED Visit) (Signed)
09/05/2023  Name: Gregory Preston MRN: [id number] DOB: 07-10-1976  Today's TOC FU Call Status: Today's TOC FU Call Status:: Successful TOC FU Call Completed TOC FU Call Complete Date: 09/05/23 (Contacted pt to assess and offer 30-day TOC program as pt is eligible for services.) Patient's Name and Date of Birth confirmed.  Transition Care Management Follow-up Telephone Call Date of Discharge: 09/03/23 Discharge Facility: [facility]) Type of Discharge: Inpatient Admission Primary Inpatient Discharge Diagnosis:: "pain of upper abd" How have you been since you were released from the hospital?: Better (Pt doing well-had a little soreness yest but none today, up walking & moving around,urine clear & yellow-denies any burning or other sxs, LBM today, eating good,reports was a 'little lightheaded" earlier today when got off couch to walk but sxs resolved) Any questions or concerns?: No  Items Reviewed: Did you receive and understand the discharge instructions provided?: Yes Medications obtained,verified, and reconciled?: Yes (Medications Reviewed) Any new allergies since your discharge?: No Dietary orders reviewed?: Yes Type of Diet Ordered:: low salt/heart healthy Do you have support at home?: Yes People in Home: spouse Name of Support/Comfort Primary Source: Annabelle Harman  Medications Reviewed Today: Medications Reviewed Today     Reviewed by Charlyn Minerva, RN (Registered Nurse) on 09/05/23 at 1232  Med List Status: <None>   Medication Order Taking? Sig Documenting Provider Last Dose Status Informant  acetaminophen (TYLENOL) 500 MG tablet [id number] Yes Take 1,000 mg by mouth every 6 (six) hours as needed. [provider] Taking Active            Med Note Colon Branch May 16, 2023 10:46 AM) Rochele Pages two yesterday at 1pm  albuterol (VENTOLIN HFA) 108 (90 Base) MCG/ACT inhaler [id number] Yes TAKE 2 PUFFS BY MOUTH EVERY 6 HOURS AS NEEDED  FOR WHEEZE OR SHORTNESS OF Milly Jakob, MD Taking Active   fluticasone furoate-vilanterol (BREO ELLIPTA) 200-25 MCG/ACT AEPB [id number] Yes INHALE 1 PUFF BY MOUTH EVERY DAY Ronnald Nian, MD Taking Active   gabapentin (NEURONTIN) 100 MG capsule [id number] No Take 1 capsule (100 mg total) by mouth 2 (two) times daily.  Patient not taking: Reported on 09/05/2023   Jac Canavan, PA-C Not Taking Active            Med Note Merrilee Seashore Sep 05, 2023 12:32 PM) Pt states not taking-was ordered for previous nerve pain/issue several months ago and sxs have since resolved  oxybutynin (DITROPAN) 5 MG tablet [id number] Yes Take 1 tablet (5 mg total) by mouth every 8 (eight) hours as needed for bladder spasms. Lurene Shadow, MD Taking Active   tamsulosin [facility]) 0.4 MG CAPS capsule [id number] Yes Take 1 capsule (0.4 mg total) by mouth daily after supper. Lurene Shadow, MD Taking Active             Home Care and Equipment/Supplies: Were Home Health Services Ordered?: NA Any new equipment or medical supplies ordered?: NA  Functional Questionnaire: Do you need assistance with bathing/showering or dressing?: No Do you need assistance with meal preparation?: No Do you need assistance with eating?: No Do you have difficulty maintaining continence: No Do you need assistance with getting out of bed/getting out of a chair/moving?: No Do you have difficulty managing or taking your medications?: No  Follow up appointments reviewed: PCP Follow-up appointment confirmed?: Yes Date of PCP follow-up appointment?: 09/13/23 Follow-up Provider: Dr. Susann Givens [facility] Follow-up appointment confirmed?: Yes Date of  Specialist follow-up appointment?: 10/01/23 Follow-Up Specialty Provider:: Dr. Apolinar Junes, pt states he has appt with urology office to collect urine specimen on 09/19/23 Do you need transportation to your follow-up appointment?: No (pt confirms he is able to get to  his appts) Do you understand care options if your condition(s) worsen?: Yes-patient verbalized understanding  SDOH Interventions Today    Flowsheet Row Most Recent Value  SDOH Interventions   Food Insecurity Interventions Intervention Not Indicated  Transportation Interventions Intervention Not Indicated      TOC Interventions Today    Flowsheet Row Most Recent Value  TOC Interventions   TOC Interventions Discussed/Reviewed TOC Interventions Discussed      Interventions Today    Flowsheet Row Most Recent Value  General Interventions   General Interventions Discussed/Reviewed General Interventions Discussed, Doctor Visits, Referral to Nurse  [pt states he is doing well since returning home-able to manage his conditions and does not need weekly TOC calls]  Doctor Visits Discussed/Reviewed Doctor Visits Discussed, PCP, Specialist  PCP/Specialist Visits Compliance with follow-up visit  Education Interventions   Education Provided Provided Education  Provided Verbal Education On Medication, Nutrition, When to see the doctor, Other  [sx mgmt]  Nutrition Interventions   Nutrition Discussed/Reviewed Nutrition Discussed, Fluid intake  Pharmacy Interventions   Pharmacy Dicussed/Reviewed Pharmacy Topics Discussed, Medications and their functions  Safety Interventions   Safety Discussed/Reviewed Safety Discussed        Antionette Fairy, RN,BSN,CCM RN Care Manager Transitions of Care  [place]/Population [facility]  Direct Phone: [phone] Toll Free: [phone] Fax: [phone]

## 2023-09-07 ENCOUNTER — Telehealth: Payer: Self-pay

## 2023-09-07 NOTE — Progress Notes (Signed)
   [facility] Surgical Posting Form  Surgery Date: Date: 10/01/2023  Surgeon: Dr. [name], MD  Inpt ( No  )   Outpt (Yes)   Obs ( No  )   Diagnosis: N20.1 Right Ureteral Stone, Ureteral Rupture  -CPT: 52356, 52310, [phone]  Surgery: Right Cystoscopy with Retrograde Pyelogram, Right Ureteroscopy with Laser Lithotripsy and Possible Stent Removal  Stop Anticoagulations: N/A  Cardiac/Medical/Pulmonary Clearance needed: no  *Orders entered into EPIC  Date: 09/07/23   *Case booked in EPIC  Date: 09/05/2023  *Notified pt of Surgery: Date: 09/05/2023  PRE-OP UA & CX: yes, will obtain in clinic on 09/20/2023  *Placed into Prior Authorization Work Angela Nevin Date: 09/07/23  Assistant/laser/rep:No

## 2023-09-07 NOTE — Telephone Encounter (Signed)
Per Dr. Apolinar Junes,  Patient is to be scheduled for Right Cystoscopy with Retrograde Pyelogram, Right Ureteroscopy with Laser Lithotripsy and Possible Stent Removal   Mr. Gregory Preston was contacted and possible surgical dates were discussed, Monday November 11th, 2024 was agreed upon for surgery.   Patient was instructed that Dr. Apolinar Junes will require them to provide a pre-op UA & CX prior to surgery. This was ordered and scheduled drop off appointment was made for 09/20/2023.    Patient was directed to call [phone] between 1-3pm the day before surgery to find out surgical arrival time.  Instructions were given not to eat or drink from midnight on the night before surgery and have a driver for the day of surgery. On the surgery day patient was instructed to enter through the [facility] entrance of [facility] report the Same Day Surgery desk.   Pre-Admit Testing will be in contact via phone to set up an interview with the anesthesia team to review your history and medications prior to surgery.   Reminder of this information was sent via MyChart to the patient.

## 2023-09-10 MED ORDER — SILODOSIN 8 MG PO CAPS: 8.0000 mg | ORAL_CAPSULE | Freq: Every day | ORAL | 0 refills | Status: DC

## 2023-09-10 NOTE — Telephone Encounter (Signed)
Spoke with patient. Patient states he is having some pain in his bladder off and on and having sensation of needing to urinate more frequent. Oxybutynin he takes and it makes him feel sleepy and Flomax he takes in the evening and it makes him dizzy. 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 He is hoping he will be ok to get back to normal after surgery on 10/01/23.

## 2023-09-13 ENCOUNTER — Inpatient Hospital Stay: Payer: BC Managed Care – PPO | Admitting: Family Medicine

## 2023-09-14 NOTE — Plan of Care (Signed)
 [facility] Tonsillectomy/Adenoidectomy, Postoperative PEDS care plan entered in error.

## 2023-09-20 ENCOUNTER — Telehealth: Payer: Self-pay | Admitting: Urology

## 2023-09-20 ENCOUNTER — Other Ambulatory Visit: Payer: Self-pay

## 2023-09-20 DIAGNOSIS — N201 Calculus of ureter: Secondary | ICD-10-CM

## 2023-09-20 LAB — URINALYSIS, COMPLETE
Bilirubin, UA: NEGATIVE
Glucose, UA: NEGATIVE
Ketones, UA: NEGATIVE
Nitrite, UA: NEGATIVE
Specific Gravity, UA: >1.03 — ABNORMAL HIGH (ref 1.005–1.030)
Urobilinogen, Ur: 0.2 mg/dL (ref 0.2–1.0)
pH, UA: 5.5 (ref 5.0–7.5)

## 2023-09-20 LAB — MICROSCOPIC EXAMINATION: RBC, Urine: >30 /[HPF] — AB (ref 0–2)

## 2023-09-20 NOTE — Telephone Encounter (Signed)
Patient was in office today for pre op ua/cx. He is requesting a note for work from 09/01/23-10/01/23. He has not been able to go to work due to medication that he is taking because if makes him sleepy, and he has to use the bathroom a lot, and any movement causes spasms. 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 This is a part time job, so there is no FMLA paperwork, just a work note is needed.

## 2023-09-21 DIAGNOSIS — S3719XA Other injury of ureter, initial encounter: Secondary | ICD-10-CM

## 2023-09-21 HISTORY — DX: Other injury of ureter, initial encounter: S37.19XA

## 2023-09-21 NOTE — Telephone Encounter (Signed)
Has he switched his medication from tamsulosin to silodosin and oxybutynin to [facility] per my prior recommendation (see MyChart messages)?

## 2023-09-23 LAB — CULTURE, URINE COMPREHENSIVE

## 2023-09-24 ENCOUNTER — Encounter
Admission: RE | Admit: 2023-09-24 | Discharge: 2023-09-24 | Disposition: A | Discharge status: Home / Self Care | Payer: BC Managed Care – PPO | Source: Ambulatory Visit | Attending: Urology | Admitting: Urology

## 2023-09-24 ENCOUNTER — Other Ambulatory Visit: Payer: Self-pay

## 2023-09-24 NOTE — Patient Instructions (Addendum)
Your procedure is scheduled on: Monday, November 11 Report to the Registration Desk on the 1st floor of the [facility] To find out your arrival time, please call [phone] between 1PM - 3PM on: Friday, November 8 If your arrival time is 6:00 am, do not arrive before that time as the [facility] entrance doors do not open until 6:00 am.  REMEMBER: Instructions that are not followed completely may result in serious medical risk, up to and including death; or upon the discretion of your surgeon and anesthesiologist your surgery may need to be rescheduled.  Do not eat or drink after midnight the night before surgery.  No gum chewing or hard candies.  One week prior to surgery: starting November 4 Stop Anti-inflammatories (NSAIDS) such as Advil, Aleve, Ibuprofen, Motrin, Naproxen, Naprosyn and Aspirin based products such as Excedrin, Goody's Powder, BC Powder. Stop ANY OVER THE COUNTER supplements until after surgery. Stop vitamin D, magnesium, multiple vitamins.  You may however, continue to take Tylenol if needed for pain up until the day of surgery.  Continue taking all of your other prescription medications up until the day of surgery.  ON THE DAY OF SURGERY ONLY TAKE THESE MEDICATIONS WITH SIPS OF WATER:  Breo ellipta inhaler  Use inhalers on the day of surgery and bring your albuterol inhaler to the hospital.  No Alcohol for 24 hours before or after surgery.  No Smoking including e-cigarettes for 24 hours before surgery.  No chewable tobacco products for at least 6 hours before surgery.  No nicotine patches on the day of surgery.  Do not use any "recreational" drugs for at least a week (preferably 2 weeks) before your surgery.  Please be advised that the combination of cocaine and anesthesia may have negative outcomes, up to and including death. If you test positive for cocaine, your surgery will be cancelled.  On the morning of surgery brush your teeth with toothpaste  and water, you may rinse your mouth with mouthwash if you wish. Do not swallow any toothpaste or mouthwash.  Do not wear jewelry, make-up, hairpins, clips or nail polish.  For welded (permanent) jewelry: bracelets, anklets, waist bands, etc.  Please have this removed prior to surgery.  If it is not removed, there is a chance that hospital personnel will need to cut it off on the day of surgery.  Do not wear lotions, powders, or perfumes.   Do not shave body hair from the neck down 48 hours before surgery.  Contact lenses, hearing aids and dentures may not be worn into surgery.  Do not bring valuables to the hospital. [facility] is not responsible for any missing/lost belongings or valuables.   Notify your doctor if there is any change in your medical condition (cold, fever, infection).  Wear comfortable clothing (specific to your surgery type) to the hospital.  After surgery, you can help prevent lung complications by doing breathing exercises.  Take deep breaths and cough every 1-2 hours.  If you are being discharged the day of surgery, you will not be allowed to drive home. You will need a responsible individual to drive you home and stay with you for 24 hours after surgery.   If you are taking public transportation, you will need to have a responsible individual with you.  Please call the Pre-admissions Testing Dept. at [phone] if you have any questions about these instructions.  Surgery Visitation Policy:  Patients having surgery or a procedure may have two visitors.  Children under the age of 15 must have an adult with them who is not the patient.

## 2023-09-25 ENCOUNTER — Other Ambulatory Visit: Payer: Self-pay

## 2023-09-25 DIAGNOSIS — N201 Calculus of ureter: Secondary | ICD-10-CM

## 2023-09-25 MED ORDER — CIPROFLOXACIN HCL 250 MG PO TABS: 250.0000 mg | ORAL_TABLET | Freq: Two times a day (BID) | ORAL | 0 refills | Status: AC

## 2023-09-25 MED ORDER — SILODOSIN 8 MG PO CAPS: 8.0000 mg | ORAL_CAPSULE | Freq: Every day | ORAL | 0 refills | Status: DC

## 2023-09-25 NOTE — Telephone Encounter (Signed)
Responded; see results note.

## 2023-09-25 NOTE — Telephone Encounter (Signed)
Called pt to inform him that he should switch medications. Pt is upset that nobody has contacted him about this. I apologized to pt on my behalf due to the delay. Pt was informed to stop flomax and start silodosin and to oxybutynin  and was offered gemtesa samples. Pt states he is just worried about his UA and wants someone to get back to him with his results. Please advise.

## 2023-09-30 MED ORDER — CEFAZOLIN SODIUM-DEXTROSE 2-4 GM/100ML-% IV SOLN
2.0000 g | INTRAVENOUS | Status: AC
  Administered 2023-10-01: 2 g via INTRAVENOUS

## 2023-09-30 MED ORDER — ORAL CARE MOUTH RINSE: 15.0000 mL | Freq: Once | OROMUCOSAL | Status: AC

## 2023-09-30 MED ORDER — LACTATED RINGERS IV SOLN
INTRAVENOUS | Status: DC
Start: 2023-09-30 — End: 2023-10-01

## 2023-09-30 MED ORDER — CHLORHEXIDINE GLUCONATE 0.12 % MT SOLN
15.0000 mL | Freq: Once | OROMUCOSAL | Status: AC
  Administered 2023-10-01: 15 mL via OROMUCOSAL

## 2023-10-01 ENCOUNTER — Ambulatory Visit: Payer: BC Managed Care – PPO

## 2023-10-01 ENCOUNTER — Other Ambulatory Visit: Payer: Self-pay

## 2023-10-01 ENCOUNTER — Ambulatory Visit
Admission: RE | Admit: 2023-10-01 | Discharge: 2023-10-01 | Disposition: A | Discharge status: Home / Self Care | Payer: BC Managed Care – PPO | Attending: Urology | Admitting: Urology

## 2023-10-01 ENCOUNTER — Encounter: Payer: Self-pay | Admitting: Urology

## 2023-10-01 ENCOUNTER — Encounter
Admission: RE | Disposition: A | Discharge status: Home / Self Care | Payer: Self-pay | Source: Home / Self Care | Attending: Urology

## 2023-10-01 DIAGNOSIS — S3719XA Other injury of ureter, initial encounter: Secondary | ICD-10-CM

## 2023-10-01 DIAGNOSIS — Z87891 Personal history of nicotine dependence: Secondary | ICD-10-CM | POA: Diagnosis not present

## 2023-10-01 DIAGNOSIS — Z6841 Body Mass Index (BMI) 40.0 and over, adult: Secondary | ICD-10-CM | POA: Diagnosis not present

## 2023-10-01 DIAGNOSIS — Z8711 Personal history of peptic ulcer disease: Secondary | ICD-10-CM | POA: Insufficient documentation

## 2023-10-01 DIAGNOSIS — N201 Calculus of ureter: Secondary | ICD-10-CM | POA: Insufficient documentation

## 2023-10-01 DIAGNOSIS — G4730 Sleep apnea, unspecified: Secondary | ICD-10-CM | POA: Insufficient documentation

## 2023-10-01 HISTORY — PX: CYSTOSCOPY W/ RETROGRADES: SHX1426

## 2023-10-01 HISTORY — PX: CYSTOSCOPY/URETEROSCOPY/HOLMIUM LASER/STENT PLACEMENT: SHX6546

## 2023-10-01 SURGERY — CYSTOSCOPY, WITH RETROGRADE PYELOGRAM
Anesthesia: General | Laterality: Right

## 2023-10-01 MED ORDER — PROPOFOL 10 MG/ML IV BOLUS
INTRAVENOUS | Status: DC | PRN
  Administered 2023-10-01: 200 mg via INTRAVENOUS
  Administered 2023-10-01: 50 mg via INTRAVENOUS
  Administered 2023-10-01: 100 ug/kg/min via INTRAVENOUS

## 2023-10-01 MED ORDER — GLYCOPYRROLATE 0.2 MG/ML IJ SOLN
INTRAMUSCULAR | Status: DC | PRN
  Administered 2023-10-01: .2 mg via INTRAVENOUS

## 2023-10-01 MED ORDER — DEXAMETHASONE SODIUM PHOSPHATE 10 MG/ML IJ SOLN
INTRAMUSCULAR | Status: AC
  Filled 2023-10-01: qty 1

## 2023-10-01 MED ORDER — CHLORHEXIDINE GLUCONATE 0.12 % MT SOLN
OROMUCOSAL | Status: AC
  Filled 2023-10-01: qty 15

## 2023-10-01 MED ORDER — MIDAZOLAM HCL 2 MG/2ML IJ SOLN
INTRAMUSCULAR | Status: AC
Start: 2023-10-01 — End: ?
  Filled 2023-10-01: qty 2

## 2023-10-01 MED ORDER — PHENYLEPHRINE 80 MCG/ML (10ML) SYRINGE FOR IV PUSH (FOR BLOOD PRESSURE SUPPORT)
PREFILLED_SYRINGE | INTRAVENOUS | Status: DC | PRN
  Administered 2023-10-01: 80 ug via INTRAVENOUS
  Administered 2023-10-01: 160 ug via INTRAVENOUS

## 2023-10-01 MED ORDER — ONDANSETRON HCL 4 MG/2ML IJ SOLN
INTRAMUSCULAR | Status: DC | PRN
  Administered 2023-10-01: 4 mg via INTRAVENOUS

## 2023-10-01 MED ORDER — ONDANSETRON HCL 4 MG/2ML IJ SOLN: 4.0000 mg | Freq: Once | INTRAMUSCULAR | Status: DC | PRN

## 2023-10-01 MED ORDER — LACTATED RINGERS IV SOLN: INTRAVENOUS | Status: DC | PRN

## 2023-10-01 MED ORDER — FENTANYL CITRATE (PF) 100 MCG/2ML IJ SOLN
INTRAMUSCULAR | Status: DC | PRN
  Administered 2023-10-01 (×2): 50 ug via INTRAVENOUS

## 2023-10-01 MED ORDER — FENTANYL CITRATE (PF) 100 MCG/2ML IJ SOLN
INTRAMUSCULAR | Status: AC
  Filled 2023-10-01: qty 2

## 2023-10-01 MED ORDER — MIDAZOLAM HCL 2 MG/2ML IJ SOLN
INTRAMUSCULAR | Status: DC | PRN
  Administered 2023-10-01: 2 mg via INTRAVENOUS

## 2023-10-01 MED ORDER — ONDANSETRON HCL 4 MG/2ML IJ SOLN
INTRAMUSCULAR | Status: AC
  Filled 2023-10-01: qty 2

## 2023-10-01 MED ORDER — SODIUM CHLORIDE 0.9 % IR SOLN
Status: DC | PRN
  Administered 2023-10-01: 3000 mL

## 2023-10-01 MED ORDER — PROPOFOL 1000 MG/100ML IV EMUL
INTRAVENOUS | Status: AC
  Filled 2023-10-01: qty 100

## 2023-10-01 MED ORDER — DEXAMETHASONE SODIUM PHOSPHATE 10 MG/ML IJ SOLN
INTRAMUSCULAR | Status: DC | PRN
  Administered 2023-10-01: 10 mg via INTRAVENOUS

## 2023-10-01 MED ORDER — OXYCODONE HCL 5 MG PO TABS
ORAL_TABLET | ORAL | Status: AC
  Filled 2023-10-01: qty 1

## 2023-10-01 MED ORDER — PHENYLEPHRINE 80 MCG/ML (10ML) SYRINGE FOR IV PUSH (FOR BLOOD PRESSURE SUPPORT)
PREFILLED_SYRINGE | INTRAVENOUS | Status: AC
  Filled 2023-10-01: qty 10

## 2023-10-01 MED ORDER — OXYCODONE HCL 5 MG PO TABS
5.0000 mg | ORAL_TABLET | Freq: Once | ORAL | Status: AC
  Administered 2023-10-01: 5 mg via ORAL

## 2023-10-01 MED ORDER — LIDOCAINE HCL (CARDIAC) PF 100 MG/5ML IV SOSY
PREFILLED_SYRINGE | INTRAVENOUS | Status: DC | PRN
  Administered 2023-10-01: 60 mg via INTRAVENOUS

## 2023-10-01 MED ORDER — FENTANYL CITRATE (PF) 100 MCG/2ML IJ SOLN
25.0000 ug | INTRAMUSCULAR | Status: DC | PRN
  Administered 2023-10-01 (×4): 25 ug via INTRAVENOUS

## 2023-10-01 MED ORDER — CEFAZOLIN SODIUM-DEXTROSE 2-4 GM/100ML-% IV SOLN
INTRAVENOUS | Status: AC
  Filled 2023-10-01: qty 100

## 2023-10-01 MED ORDER — HYDROCODONE-ACETAMINOPHEN 5-325 MG PO TABS: 1.0000 | ORAL_TABLET | Freq: Four times a day (QID) | ORAL | 0 refills | Status: DC | PRN

## 2023-10-01 MED ORDER — LIDOCAINE HCL (PF) 2 % IJ SOLN
INTRAMUSCULAR | Status: AC
Start: 2023-10-01 — End: ?
  Filled 2023-10-01: qty 5

## 2023-10-01 MED ORDER — IOHEXOL 180 MG/ML  SOLN
INTRAMUSCULAR | Status: DC | PRN
  Administered 2023-10-01: 20 mL

## 2023-10-01 SURGICAL SUPPLY — 29 items
ADH LQ OCL WTPRF AMP STRL LF (MISCELLANEOUS)
ADHESIVE MASTISOL STRL (MISCELLANEOUS) IMPLANT
BAG DRAIN SIEMENS DORNER NS (MISCELLANEOUS) ×1 IMPLANT
BAG DRN NS LF (MISCELLANEOUS) ×1
BASKET ZERO TIP 1.9FR (BASKET) IMPLANT
BRUSH SCRUB EZ 1% IODOPHOR (MISCELLANEOUS) ×1 IMPLANT
BSKT STON RTRVL ZERO TP 1.9FR (BASKET) ×1
CATH URET FLEX-TIP 2 LUMEN 10F (CATHETERS) IMPLANT
CATH URETL OPEN 5X70 (CATHETERS) ×1 IMPLANT
CNTNR URN SCR LID CUP LEK RST (MISCELLANEOUS) IMPLANT
CONT SPEC 4OZ STRL OR WHT (MISCELLANEOUS)
DRAPE UTILITY 15X26 TOWEL STRL (DRAPES) ×1 IMPLANT
DRSG TEGADERM 2-3/8X2-3/4 SM (GAUZE/BANDAGES/DRESSINGS) IMPLANT
FIBER LASER MOSES 200 DFL (Laser) IMPLANT
GLOVE BIO SURGEON STRL SZ 6.5 (GLOVE) ×1 IMPLANT
GOWN STRL REUS W/ TWL LRG LVL3 (GOWN DISPOSABLE) ×2 IMPLANT
GOWN STRL REUS W/TWL LRG LVL3 (GOWN DISPOSABLE) ×2
GUIDEWIRE GREEN .038 145CM (MISCELLANEOUS) IMPLANT
GUIDEWIRE STR DUAL SENSOR (WIRE) ×1 IMPLANT
IV NS IRRIG 3000ML ARTHROMATIC (IV SOLUTION) ×1 IMPLANT
KIT TURNOVER CYSTO (KITS) ×1 IMPLANT
PACK CYSTO AR (MISCELLANEOUS) ×1 IMPLANT
SET CYSTO W/LG BORE CLAMP LF (SET/KITS/TRAYS/PACK) ×1 IMPLANT
SHEATH NAVIGATOR HD 12/14X36 (SHEATH) IMPLANT
STENT URET 6FRX24 CONTOUR (STENTS) IMPLANT
STENT URET 6FRX26 CONTOUR (STENTS) IMPLANT
SURGILUBE 2OZ TUBE FLIPTOP (MISCELLANEOUS) ×1 IMPLANT
WATER STERILE IRR 1000ML POUR (IV SOLUTION) ×1 IMPLANT
WATER STERILE IRR 500ML POUR (IV SOLUTION) ×1 IMPLANT

## 2023-10-01 NOTE — Anesthesia Preprocedure Evaluation (Signed)
Anesthesia Evaluation  Patient identified by MRN, date of birth, ID band Patient awake    Reviewed: Allergy & Precautions, H&P , NPO status , Patient's Chart, lab work & pertinent test results, reviewed documented beta blocker date and time   Airway Mallampati: II  TM Distance: >3 FB Neck ROM: full    Dental  (+) Teeth Intact   Pulmonary asthma , sleep apnea and Continuous Positive Airway Pressure Ventilation , former smoker   Pulmonary exam normal        Cardiovascular Exercise Tolerance: Poor negative cardio ROS Normal cardiovascular exam Rate:Normal     Neuro/Psych negative neurological ROS  negative psych ROS   GI/Hepatic Neg liver ROS, PUD,,,  Endo/Other    Morbid obesity  Renal/GU Renal disease  negative genitourinary   Musculoskeletal   Abdominal   Peds  Hematology negative hematology ROS (+)   Anesthesia Other Findings   Reproductive/Obstetrics negative OB ROS                             Anesthesia Physical Anesthesia Plan  ASA: 3  Anesthesia Plan: General LMA   Post-op Pain Management:    Induction:   PONV Risk Score and Plan: 3  Airway Management Planned:   Additional Equipment:   Intra-op Plan:   Post-operative Plan:   Informed Consent: I have reviewed the patients History and Physical, chart, labs and discussed the procedure including the risks, benefits and alternatives for the proposed anesthesia with the patient or authorized representative who has indicated his/her understanding and acceptance.       Plan Discussed with: CRNA  Anesthesia Plan Comments:        Anesthesia Quick Evaluation

## 2023-10-01 NOTE — Anesthesia Procedure Notes (Signed)
Procedure Name: LMA Insertion Date/Time: 10/01/2023 11:03 AM  Performed by: Lysbeth Penner, CRNAPre-anesthesia Checklist: Patient identified, Emergency Drugs available, Suction available and Patient being monitored Patient Re-evaluated:Patient Re-evaluated prior to induction Oxygen Delivery Method: Circle system utilized Preoxygenation: Pre-oxygenation with 100% oxygen Induction Type: IV induction Ventilation: Mask ventilation without difficulty LMA: LMA inserted LMA Size: 4.0 Number of attempts: 1 Placement Confirmation: positive ETCO2 and breath sounds checked- equal and bilateral Tube secured with: Tape Dental Injury: Teeth and Oropharynx as per pre-operative assessment

## 2023-10-01 NOTE — Interval H&P Note (Signed)
History and Physical Interval Note:  10/01/2023 10:37 AM  [facility] CTAB  Returns today for staged procedure to reevaluate for ongoing leak via retrograde, possible ureteroscopy with laser tripsy, possible stent replacement.      Gregory Preston  has presented today for surgery, with the diagnosis of Right Ureteral Stone, Right Ureteral Rupture.  The various methods of treatment have been discussed with the patient and family. After consideration of risks, benefits and other options for treatment, the patient has consented to  Procedure(s): CYSTOSCOPY WITH RETROGRADE PYELOGRAM (Right) CYSTOSCOPY/URETEROSCOPY/HOLMIUM LASER/STENT EXCHANGE VERSUS REMOVAL (Right) as a surgical intervention.  The patient's history has been reviewed, patient examined, no change in status, stable for surgery.  I have reviewed the patient's chart and labs.  Questions were answered to the patient's satisfaction.     [name]

## 2023-10-01 NOTE — Discharge Instructions (Signed)
Now that your stent is out, your pain should begin to improve.  You may continue to have ureteral spasms and discomfort and some urinary symptoms for a few days.  Your symptoms should resolve thereafter.

## 2023-10-01 NOTE — Op Note (Signed)
Date of procedure: 10/01/23  Preoperative diagnosis:  Right distal ureteral calculus Right ureteral injury/rupture  Postoperative diagnosis:  Same as above  Procedure: Cystoscopy Right ureteroscopy with basket extraction of stone Right retrograde pyelogram Right ureteral stent removal  Interpretation of fluoroscopy less than 30 minutes  Surgeon: [name], MD  Anesthesia: General  Complications: None  Intraoperative findings: Small right distal ureteral calculus still present, amenable to extraction via basket.  Mild stent encrustation.  Retrograde pyelogram unremarkable, no evidence of contrast extravasation or persistent injury.  EBL: Minimal  Specimens: None  Drains: None  Indication: Gregory Preston is a 47 y.o. patient with personal history of right distal ureteral calculus who presented with significant proximal/UPJ urinary extravasation associated with the acute stone event.  He underwent stent placement and returns today for definitive management of his stone and to reevaluate the injury..  After reviewing the management options for treatment, he elected to proceed with the above surgical procedure(s). We have discussed the potential benefits and risks of the procedure, side effects of the proposed treatment, the likelihood of the patient achieving the goals of the procedure, and any potential problems that might occur during the procedure or recuperation. Informed consent has been obtained.  Description of procedure:  The patient was taken to the operating room and general anesthesia was induced.  The patient was placed in the dorsal lithotomy position, prepped and draped in the usual sterile fashion, and preoperative antibiotics were administered. A preoperative time-out was performed.   A 21 French scope was advanced per urethra into the bladder.  Attention was turned to the right side.  On scout imaging, the ureteral stent could be seen.  There are some mild  encrustation on the distal coil.  Stent graspers were used to pull this to the level of the meatus.  Was then cannulated using a sensor wire up to the level of the kidney.  The stent was removed and the wire was snapped in place as a safety wire.  I then advanced a long semirigid ureteroscope into the distal ureter where the stone was still identified.  It was relatively small and the ureter was dilated.  This was deemed amenable to basket extraction.  A 1.9 French tipless nitinol basket was used to remove the stone entirety with very little trauma.  I was then able to advance the scope just over a third of the way up the ureter and no additional stones or stone fragments were identified.  I then performed a retrograde pyelogram through the scope which showed decompressed ureter and upper tract collecting system without hydronephrosis contrast extravasation or filling defects.  Appeared that the injury was no longer present and the collecting system was intact.  As such in light of minimal trauma, I elected to not replace the stent.  The scope was removed, the bladder was drained and the safety wire was also discontinued.  The patient was cleaned and dried, repositioned in the supine position, reversed from anesthesia, and taken to the PACU in stable condition.  Plan: Recommend outpatient follow-up in 4 to 6 weeks with renal ultrasound prior.  He will need a work note for having missed work since his initial presentation.  [name], M.D.

## 2023-10-01 NOTE — Transfer of Care (Signed)
Immediate Anesthesia Transfer of Care Note  Patient: Gregory Preston  Procedure(s) Performed: CYSTOSCOPY WITH RETROGRADE PYELOGRAM (Right) CYSTOSCOPY/URETEROSCOPY, STENT REMOVAL (Right)  Patient Location: PACU  Anesthesia Type:General  Level of Consciousness: sedated  Airway & Oxygen Therapy: Patient Spontanous Breathing  Post-op Assessment: Report given to RN and Post -op Vital signs reviewed and stable  Post vital signs: Reviewed and stable  Last Vitals:  Vitals Value Taken Time  BP    Temp    Pulse 98 10/01/23 1130  Resp 16 10/01/23 1130  SpO2 96 % 10/01/23 1130  Vitals shown include unfiled device data.  Last Pain:  Vitals:   10/01/23 0859  TempSrc: Temporal  PainSc: 0-No pain         Complications: There were no known notable events for this encounter.

## 2023-10-02 ENCOUNTER — Other Ambulatory Visit: Payer: Self-pay

## 2023-10-02 DIAGNOSIS — N201 Calculus of ureter: Secondary | ICD-10-CM

## 2023-10-02 NOTE — Anesthesia Postprocedure Evaluation (Signed)
Anesthesia Post Note  Patient: Gregory Preston  Procedure(s) Performed: CYSTOSCOPY WITH RETROGRADE PYELOGRAM (Right) CYSTOSCOPY/URETEROSCOPY, STENT REMOVAL (Right)  Patient location during evaluation: PACU Anesthesia Type: General Level of consciousness: awake and alert Pain management: pain level controlled Vital Signs Assessment: post-procedure vital signs reviewed and stable Respiratory status: spontaneous breathing, nonlabored ventilation, respiratory function stable and patient connected to nasal cannula oxygen Cardiovascular status: blood pressure returned to baseline and stable Postop Assessment: no apparent nausea or vomiting Anesthetic complications: no   There were no known notable events for this encounter.   Last Vitals:  Vitals:   10/01/23 1220 10/01/23 1227  BP:  (!) 139/90  Pulse: 95 84  Resp: (!) 8 12  Temp:  (!) 36.1 C  SpO2: 97% 98%    Last Pain:  Vitals:   10/01/23 1227  TempSrc: Temporal  PainSc: 4                  Yevette Edwards

## 2023-10-31 ENCOUNTER — Other Ambulatory Visit: Payer: Self-pay | Admitting: Family Medicine

## 2023-10-31 ENCOUNTER — Ambulatory Visit
Admission: RE | Admit: 2023-10-31 | Discharge: 2023-10-31 | Disposition: A | Discharge status: Home / Self Care | Payer: BC Managed Care – PPO | Source: Ambulatory Visit | Attending: Urology | Admitting: Urology

## 2023-10-31 DIAGNOSIS — N201 Calculus of ureter: Secondary | ICD-10-CM | POA: Diagnosis present

## 2023-11-07 ENCOUNTER — Ambulatory Visit: Payer: BC Managed Care – PPO | Admitting: Urology

## 2023-11-28 ENCOUNTER — Encounter: Payer: Self-pay | Admitting: Family Medicine

## 2023-11-28 ENCOUNTER — Ambulatory Visit: Payer: BC Managed Care – PPO | Admitting: Family Medicine

## 2023-11-28 VITALS — BP 118/74 | HR 86 | Ht 70.0 in | Wt 336.2 lb

## 2023-11-28 DIAGNOSIS — S3719XA Other injury of ureter, initial encounter: Secondary | ICD-10-CM

## 2023-11-28 DIAGNOSIS — R7301 Impaired fasting glucose: Secondary | ICD-10-CM | POA: Diagnosis not present

## 2023-11-28 DIAGNOSIS — Z0000 Encounter for general adult medical examination without abnormal findings: Secondary | ICD-10-CM | POA: Diagnosis not present

## 2023-11-28 DIAGNOSIS — H6993 Unspecified Eustachian tube disorder, bilateral: Secondary | ICD-10-CM

## 2023-11-28 DIAGNOSIS — Z87442 Personal history of urinary calculi: Secondary | ICD-10-CM | POA: Diagnosis not present

## 2023-11-28 DIAGNOSIS — J4520 Mild intermittent asthma, uncomplicated: Secondary | ICD-10-CM | POA: Diagnosis not present

## 2023-11-28 DIAGNOSIS — M4802 Spinal stenosis, cervical region: Secondary | ICD-10-CM | POA: Diagnosis not present

## 2023-11-28 DIAGNOSIS — D72829 Elevated white blood cell count, unspecified: Secondary | ICD-10-CM

## 2023-11-28 LAB — POCT GLYCOSYLATED HEMOGLOBIN (HGB A1C): Hemoglobin A1C: 5.6 % (ref 4.0–5.6)

## 2023-11-28 LAB — LIPID PANEL

## 2023-11-28 MED ORDER — ALBUTEROL SULFATE HFA 108 (90 BASE) MCG/ACT IN AERS: 2.0000 | INHALATION_SPRAY | Freq: Four times a day (QID) | RESPIRATORY_TRACT | 1 refills | Status: DC | PRN

## 2023-11-28 MED ORDER — FLUTICASONE FUROATE-VILANTEROL 200-25 MCG/ACT IN AEPB: INHALATION_SPRAY | RESPIRATORY_TRACT | 3 refills | Status: AC

## 2023-11-28 NOTE — Progress Notes (Signed)
 Complete physical exam  Patient: Gregory Preston   DOB: June 30, 1976   48 y.o. Male  MRN: [id number]  Subjective:    Chief Complaint  Patient presents with   Annual Exam    Fasting.    Gregory Preston is a 48 y.o. male who presents today for a complete physical exam. He reports consuming a general diet. The patient has a physically strenuous job, but has no regular exercise apart from work.  He generally feels well. He reports sleeping poorly. He does have a previous history of cervical disc disease that did not respond to conservative care.  He also had difficulty with a renal stone and ureteral rupture requiring a stent being placed.  He is now back to normal with all that.  He is at work.  Does have underlying asthma and does use Breo and occasionally will use a rescue inhaler. He has a history of glucose intolerance as well as leukocytosis.  Otherwise family and social history as well as health maintenance and immunizations was reviewed.   Most recent fall risk assessment:    11/28/2023    8:13 AM  Fall Risk   Falls in the past year? 0  Number falls in past yr: 0  Injury with Fall? 0     Most recent depression screenings:    11/28/2023    8:13 AM 11/07/2022    9:38 AM  PHQ 2/9 Scores  PHQ - 2 Score 0 0    Vision:Within last year and Dental: No current dental problems and No regular dental care     Patient Care Team: Joyce Norleen BROCKS, MD as PCP - General (Family Medicine)   Outpatient Medications Prior to Visit  Medication Sig   magnesium oxide (MAG-OX) 400 (240 Mg) MG tablet Take 400 mg by mouth daily as needed.   Multiple Vitamin (MULTIVITAMIN WITH MINERALS) TABS tablet Take 1 tablet by mouth daily as needed.   [DISCONTINUED] albuterol  (VENTOLIN  HFA) 108 (90 Base) MCG/ACT inhaler TAKE 2 PUFFS BY MOUTH EVERY 6 HOURS AS NEEDED FOR WHEEZE OR SHORTNESS OF BREATH   [DISCONTINUED] fluticasone  furoate-vilanterol (BREO ELLIPTA ) 200-25 MCG/ACT AEPB INHALE 1 PUFF BY MOUTH EVERY  DAY   acetaminophen  (TYLENOL ) 500 MG tablet Take 1,000 mg by mouth every 6 (six) hours as needed (pain.). (Patient not taking: Reported on 11/28/2023)   Cholecalciferol (VITAMIN D-3) 125 MCG (5000 UT) TABS Take 1 tablet by mouth every other day. (Patient not taking: Reported on 11/28/2023)   HYDROcodone -acetaminophen  (NORCO/VICODIN) 5-325 MG tablet Take 1-2 tablets by mouth every 6 (six) hours as needed for moderate pain (pain score 4-6). (Patient not taking: Reported on 11/28/2023)   oxybutynin  (DITROPAN ) 5 MG tablet Take 1 tablet (5 mg total) by mouth every 8 (eight) hours as needed for bladder spasms. (Patient not taking: Reported on 11/28/2023)   silodosin  (RAPAFLO ) 8 MG CAPS capsule Take 1 capsule (8 mg total) by mouth daily with breakfast. (Patient not taking: Reported on 11/28/2023)   tamsulosin  (FLOMAX ) 0.4 MG CAPS capsule Take 0.4 mg by mouth daily at 6 PM. (Patient not taking: Reported on 11/28/2023)   No facility-administered medications prior to visit.    Review of Systems  All other systems reviewed and are negative.         Objective:     BP 118/74   Pulse 86   Ht 5' 10 (1.778 m)   Wt (!) 336 lb 3.2 oz (152.5 kg)   SpO2 97%   BMI 48.24 kg/m  Physical Exam  Alert and in no distress. Tympanic membranes and canals are normal. Pharyngeal area is normal. Neck is supple without adenopathy or thyromegaly. Cardiac exam shows a regular sinus rhythm without murmurs or gallops. Lungs are clear to auscultation.      Assessment & Plan:    Routine general medical examination at a health care facility - Plan: CBC with Differential/Platelet, Comprehensive metabolic panel, Lipid panel  Mild intermittent asthma in adult without complication  Cervical spinal stenosis  Impaired fasting blood sugar - Plan: POCT glycosylated hemoglobin (Hb A1C)  Obesity, Class III, BMI 40-49.9 (morbid obesity) (HCC) - Plan: CBC with Differential/Platelet, Comprehensive metabolic panel, Lipid  panel  History of renal stone  Leukocytosis, unspecified type - Plan: CBC with Differential/Platelet  Immunization History  Administered Date(s) Administered   Influenza, Seasonal, Injecte, Preservative Fre 09/15/2023   Influenza,inj,Quad PF,6+ Mos 10/21/2021, 11/07/2022   PFIZER(Purple Top)SARS-COV-2 Vaccination 02/19/2020, 03/15/2020, 10/26/2020   Pfizer Covid-19 Vaccine Bivalent Booster 73yrs & up 09/03/2021   Pfizer(Comirnaty)Fall Seasonal Vaccine 12 years and older 08/25/2022, 10/06/2023   Pneumococcal Polysaccharide-23 08/23/2012, 10/21/2021   Tdap 04/25/2006, 10/21/2021    Health Maintenance  Topic Date Due   Fecal DNA (Cologuard)  11/28/2024   DTaP/Tdap/Td (3 - Td or Tdap) 10/22/2031   INFLUENZA VACCINE  Completed   COVID-19 Vaccine  Completed   Hepatitis C Screening  Completed   HIV Screening  Completed   HPV VACCINES  Aged Out    Discussed weight loss management with him and he will check his insurance to see if it is covered and let me know.  Also discussed the fact that he needs to keep a coating medication around if he has a kidney stone again in the future and to call me to expedite care and the plan from having to go to the emergency room. Problem List Items Addressed This Visit     Asthma in adult   Relevant Medications   fluticasone  furoate-vilanterol (BREO ELLIPTA ) 200-25 MCG/ACT AEPB   albuterol  (VENTOLIN  HFA) 108 (90 Base) MCG/ACT inhaler   Cervical spinal stenosis   History of renal stone   Impaired fasting blood sugar   Relevant Orders   POCT glycosylated hemoglobin (Hb A1C)   Leukocytosis   Relevant Orders   CBC with Differential/Platelet   Obesity, Class III, BMI 40-49.9 (morbid obesity) (HCC)   Relevant Orders   CBC with Differential/Platelet   Comprehensive metabolic panel   Lipid panel   Other Visit Diagnoses       Routine general medical examination at a health care facility    -  Primary   Relevant Orders   CBC with  Differential/Platelet   Comprehensive metabolic panel   Lipid panel      Follow up one year    Norleen Jobs, MD

## 2023-11-29 LAB — COMPREHENSIVE METABOLIC PANEL
ALT: 19 [IU]/L (ref 0–44)
AST: 19 [IU]/L (ref 0–40)
Albumin: 4.6 g/dL (ref 4.1–5.1)
Alkaline Phosphatase: 72 [IU]/L (ref 44–121)
BUN/Creatinine Ratio: 11 (ref 9–20)
BUN: 11 mg/dL (ref 6–24)
Bilirubin Total: 0.5 mg/dL (ref 0.0–1.2)
CO2: 20 mmol/L (ref 20–29)
Calcium: 9.8 mg/dL (ref 8.7–10.2)
Chloride: 103 mmol/L (ref 96–106)
Creatinine, Ser: 0.98 mg/dL (ref 0.76–1.27)
Globulin, Total: 2.6 g/dL (ref 1.5–4.5)
Glucose: 88 mg/dL (ref 70–99)
Potassium: 4.7 mmol/L (ref 3.5–5.2)
Sodium: 142 mmol/L (ref 134–144)
Total Protein: 7.2 g/dL (ref 6.0–8.5)
eGFR: 95 mL/min/{1.73_m2} (ref >59)

## 2023-11-29 LAB — CBC WITH DIFFERENTIAL/PLATELET
Basophils Absolute: 0 10*3/uL (ref 0.0–0.2)
Basos: 0 %
EOS (ABSOLUTE): 0.2 10*3/uL (ref 0.0–0.4)
Eos: 2 %
Hematocrit: 47.1 % (ref 37.5–51.0)
Hemoglobin: 15.6 g/dL (ref 13.0–17.7)
Immature Grans (Abs): 0 10*3/uL (ref 0.0–0.1)
Immature Granulocytes: 0 %
Lymphocytes Absolute: 2.9 10*3/uL (ref 0.7–3.1)
Lymphs: 21 %
MCH: 30.4 pg (ref 26.6–33.0)
MCHC: 33.1 g/dL (ref 31.5–35.7)
MCV: 92 fL (ref 79–97)
Monocytes Absolute: 1 10*3/uL — ABNORMAL HIGH (ref 0.1–0.9)
Monocytes: 7 %
Neutrophils Absolute: 9.4 10*3/uL — ABNORMAL HIGH (ref 1.4–7.0)
Neutrophils: 70 %
Platelets: 245 10*3/uL (ref 150–450)
RBC: 5.14 x10E6/uL (ref 4.14–5.80)
RDW: 12.4 % (ref 11.6–15.4)
WBC: 13.6 10*3/uL — ABNORMAL HIGH (ref 3.4–10.8)

## 2023-11-29 LAB — LIPID PANEL
Cholesterol, Total: 190 mg/dL (ref 100–199)
HDL: 50 mg/dL (ref >39)
LDL CALC COMMENT:: 3.8 ratio (ref 0.0–5.0)
LDL Chol Calc (NIH): 112 mg/dL — ABNORMAL HIGH (ref 0–99)
Triglycerides: 161 mg/dL — ABNORMAL HIGH (ref 0–149)
VLDL Cholesterol Cal: 28 mg/dL (ref 5–40)

## 2023-11-30 ENCOUNTER — Telehealth: Payer: Self-pay

## 2023-11-30 NOTE — Telephone Encounter (Signed)
 Pt would like to inform you that the pill forms of weight loss medications are covered under his insurance with a PA

## 2023-11-30 NOTE — Telephone Encounter (Signed)
 Wife notified and will call back.

## 2023-12-05 ENCOUNTER — Ambulatory Visit (INDEPENDENT_AMBULATORY_CARE_PROVIDER_SITE_OTHER): Payer: 59 | Admitting: Urology

## 2023-12-05 VITALS — BP 123/86 | HR 87 | Ht 70.0 in | Wt 339.0 lb

## 2023-12-05 DIAGNOSIS — Z87442 Personal history of urinary calculi: Secondary | ICD-10-CM

## 2023-12-05 DIAGNOSIS — Z09 Encounter for follow-up examination after completed treatment for conditions other than malignant neoplasm: Secondary | ICD-10-CM | POA: Diagnosis not present

## 2023-12-05 DIAGNOSIS — N201 Calculus of ureter: Secondary | ICD-10-CM

## 2023-12-05 NOTE — Progress Notes (Signed)
Gregory Preston,acting as a scribe for Gregory Scotland, MD.,have documented all relevant documentation on the behalf of Gregory Scotland, MD,as directed by  Gregory Scotland, MD while in the presence of Gregory Scotland, MD.  12/05/23 10:28 AM   Gregory Preston 03-Jun-1976 [id number]  Referring provider: Ronnald Nian, MD [address] [place],  [place] [address]  Chief Complaint  Patient presents with   Follow-up    HPI: 48 year-old male who presents today for follow up.   He had a fairly unusual presentation, found to have a right distal ureteral calculus that caused disruption, extravasation of the proximal ureter, UPJ. He underwent stent placement followed by a staged ureteroscopy, at which time the stone was removed. His retrograde was otherwise unremarkable with no evidence of extravasation at the time. He had a renal ultrasound completed on 08/31/2023, which was unremarkable.   He reports drinking water and green tea, avoiding sodas, and has been mindful of sodium intake. He has a family history of stones. He was previously sedentary due to neck issues and was on painkillers, which may have contributed to the stone formation. He consumes collard greens and spinach occasionally and has been advised to moderate intake due to oxalate content.  PMH: Past Medical History:  Diagnosis Date   Allergy    Asthma    Moderate persistent asthma   Eczema    Hand fracture, right 01/19/2012   Obstructive sleep apnea 05/2018   Peptic ulcer disease 09/20/2010   s/p NSAID use, resolved   Right ureteral stone 08/2023   Rupture of ureter 09/2023   right   Wears glasses     Surgical History: Past Surgical History:  Procedure Laterality Date   BONE CYST EXCISION  08/20/2010   mandible cyst excised   CYSTOSCOPY W/ RETROGRADES Right 10/01/2023   Procedure: CYSTOSCOPY WITH RETROGRADE PYELOGRAM;  Surgeon: Gregory Scotland, MD;  Location: [facility];  Service: Urology;  Laterality: Right;    CYSTOSCOPY W/ URETERAL STENT PLACEMENT Right 09/02/2023   Procedure: CYSTOSCOPY WITH STENT REPLACEMENT;  Surgeon: Gregory Scotland, MD;  Location: [facility];  Service: Urology;  Laterality: Right;   CYSTOSCOPY/URETEROSCOPY/HOLMIUM LASER/STENT PLACEMENT Right 10/01/2023   Procedure: CYSTOSCOPY/URETEROSCOPY, STENT REMOVAL;  Surgeon: Gregory Scotland, MD;  Location: [facility];  Service: Urology;  Laterality: Right;    Home Medications:  Allergies as of 12/05/2023   No Known Allergies      Medication List        Accurate as of December 05, 2023 10:28 AM. If you have any questions, ask your nurse or doctor.          STOP taking these medications    acetaminophen 500 MG tablet Commonly known as: TYLENOL Stopped by: Gregory Preston   HYDROcodone-acetaminophen 5-325 MG tablet Commonly known as: NORCO/VICODIN Stopped by: Gregory Preston   oxybutynin 5 MG tablet Commonly known as: DITROPAN Stopped by: Gregory Preston   silodosin 8 MG Caps capsule Commonly known as: RAPAFLO Stopped by: Gregory Preston   tamsulosin 0.4 MG Caps capsule Commonly known as: FLOMAX Stopped by: Gregory Preston       TAKE these medications    albuterol 108 (90 Base) MCG/ACT inhaler Commonly known as: VENTOLIN HFA Inhale 2 puffs into the lungs every 6 (six) hours as needed for wheezing or shortness of breath.   fluticasone furoate-vilanterol 200-25 MCG/ACT Aepb Commonly known as: Breo Ellipta INHALE 1 PUFF BY MOUTH EVERY DAY   magnesium oxide 400 (240 Mg) MG tablet Commonly known as: MAG-OX  Take 400 mg by mouth daily as needed.   multivitamin with minerals Tabs tablet Take 1 tablet by mouth daily as needed.   Vitamin D-3 125 MCG (5000 UT) Tabs Take 1 tablet by mouth every other day.        Family History: Family History  Problem Relation Age of Onset   Peripheral vascular disease Father    Heart disease Father        pacemaker   COPD Father    Heart disease Maternal Grandfather     Hypertension Neg Hx    Hyperlipidemia Neg Hx    Stroke Neg Hx    Diabetes Neg Hx    Cancer Neg Hx     Social History:  reports that he has quit smoking. His smoking use included cigars and cigarettes. He has a 2.5 pack-year smoking history. He has never used smokeless tobacco. He reports current alcohol use of about 2.0 standard drinks of alcohol per week. He reports current drug use. Drug: Marijuana.   Physical Exam: BP 123/86   Pulse 87   Ht 5\' 10"  (1.778 m)   Wt (!) 339 lb (153.8 kg)   BMI 48.64 kg/m   Constitutional:  Alert and oriented, No acute distress. HEENT: [place] AT, moist mucus membranes.  Trachea midline, no masses. Neurologic: Grossly intact, no focal deficits, moving all 4 extremities. Psychiatric: Normal mood and affect.   Pertinent Imaging: EXAM: ULTRASOUND ABDOMEN LIMITED RIGHT UPPER QUADRANT   COMPARISON:  None Available.   FINDINGS: Gallbladder:   No gallstones or wall thickening visualized (1.8 mm). No sonographic Murphy sign noted by sonographer.   Common bile duct:   Diameter: 5.3 mm   Liver:   No focal lesion identified. The liver parenchyma is nodular in contour and diffusely increased in echogenicity. Portal vein is patent on color Doppler imaging with normal direction of blood flow towards the liver.   Other: None.   IMPRESSION: Hepatic steatosis without focal liver lesions.     Electronically Signed   By: Aram Candela M.D.   On: 09/01/2023 23:46  This was personally reviewed and I agree with the radiologic interpretation.   EXAM: RENAL / URINARY TRACT ULTRASOUND COMPLETE  COMPARISON:  CT abdomen pelvis 09/02/2023  FINDINGS: Right Kidney:  Renal measurements: 12.5 x 4.8 x 6.2 cm = volume: 193.5 mL. Echogenicity within normal limits. No mass or hydronephrosis visualized.  Left Kidney:  Renal measurements: 12.3 x 6.8 x 5.6 cm = volume: 241.6 mL. Echogenicity within normal limits. No mass or  hydronephrosis visualized.  Bladder:  Appears normal for degree of bladder distention.  Other:  None.  IMPRESSION: No hydronephrosis.   Electronically Signed By: Annia Belt M.D. On: 10/31/2023 14:32  This was personally reviewed and I agree with the radiologic interpretation.   Assessment & Plan:    1. Ureteral calculus -Reassuring follow-up imaging, currently stone free - We discussed general stone prevention techniques including drinking plenty water with goal of producing 2.5 L urine daily, increased citric acid intake, avoidance of high oxalate containing foods, and decreased salt intake.  Information about dietary recommendations given today.  - Provided a booklet on stone prevention for further guidance. - No need for routine follow-up unless symptoms suggestive of stone recurrence occur.  Return if symptoms worsen or fail to improve.  I have reviewed the above documentation for accuracy and completeness, and I agree with the above.   Gregory Scotland, MD   [facility] [address] [place], [place] [address] [phone]

## 2024-01-15 ENCOUNTER — Encounter: Payer: Self-pay | Admitting: Internal Medicine

## 2024-03-13 ENCOUNTER — Other Ambulatory Visit: Payer: Self-pay | Admitting: Family Medicine

## 2024-05-26 ENCOUNTER — Other Ambulatory Visit: Payer: Self-pay | Admitting: Family Medicine

## 2024-07-25 ENCOUNTER — Other Ambulatory Visit: Payer: Self-pay | Admitting: Family Medicine

## 2024-09-24 ENCOUNTER — Other Ambulatory Visit: Payer: Self-pay | Admitting: Family Medicine

## 2025-01-06 ENCOUNTER — Encounter: Payer: 59 | Admitting: Family Medicine

## 2025-01-06 ENCOUNTER — Encounter: Payer: Self-pay | Admitting: Family Medicine
